# Patient Record
Sex: Male | Born: 1955 | Race: White | Hispanic: No | Marital: Married
Health system: Midwestern US, Academic
[De-identification: ages and names within clinical notes are randomized; demographics above are authoritative.]

---

## 2017-10-20 IMAGING — CR PELVIS
6 series · 6 of 6 positions shown · non-contrast
Comparison: none

[l-spine ap]
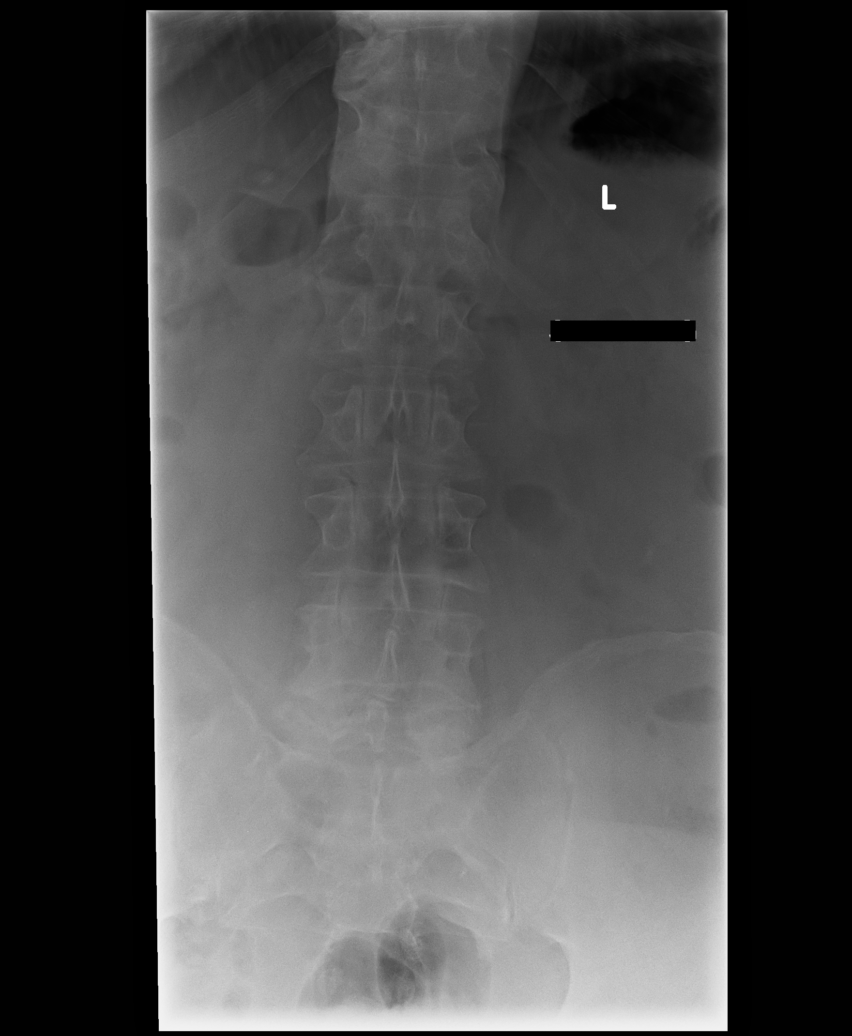

[l-spine obl (1 of 2)]
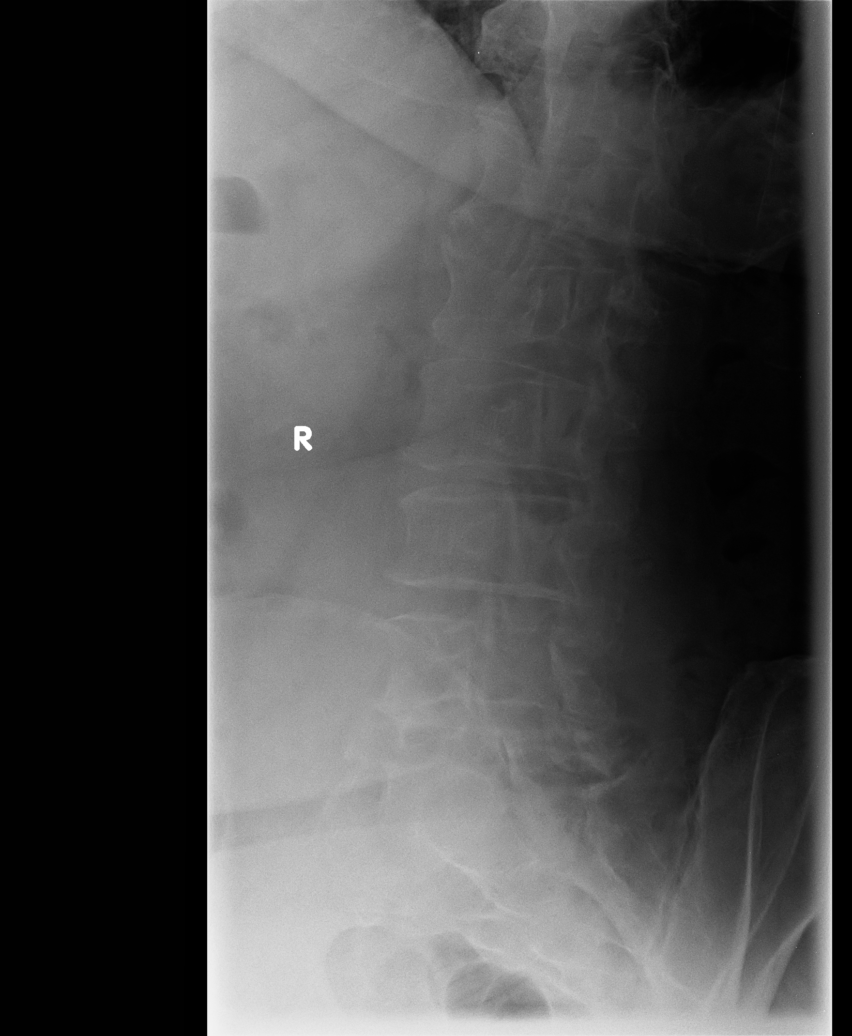

[l-spine obl (2 of 2)]
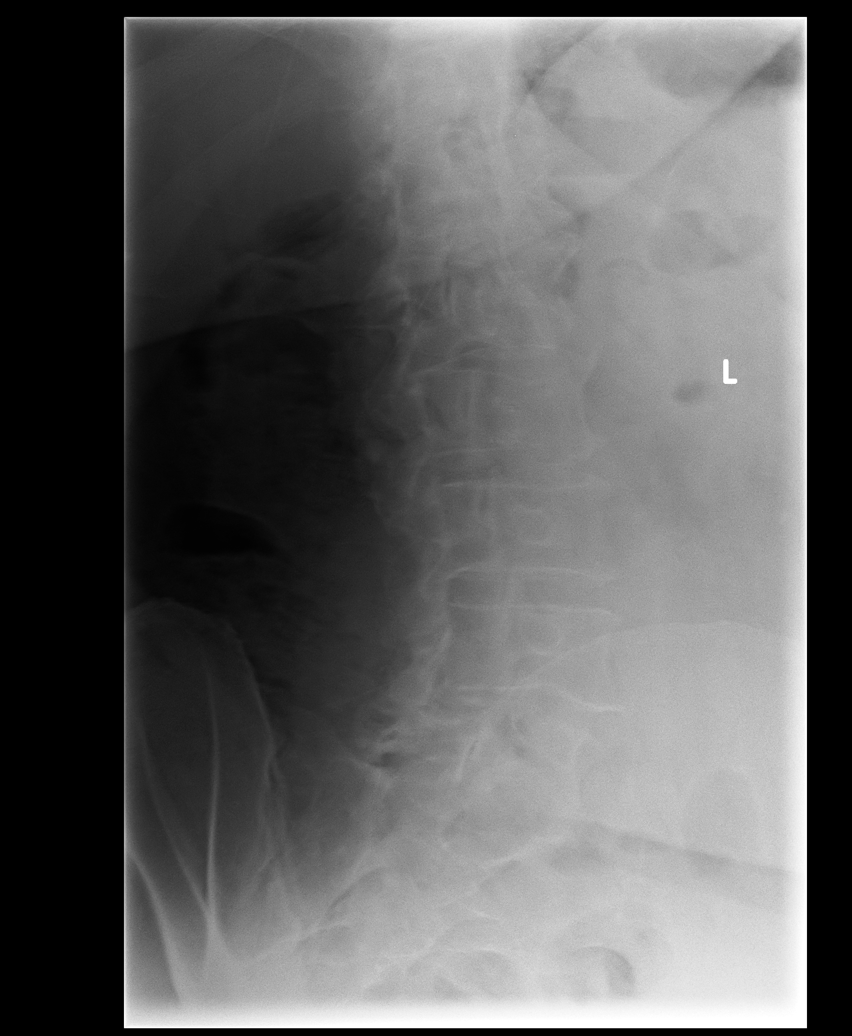

[l-spine lat]
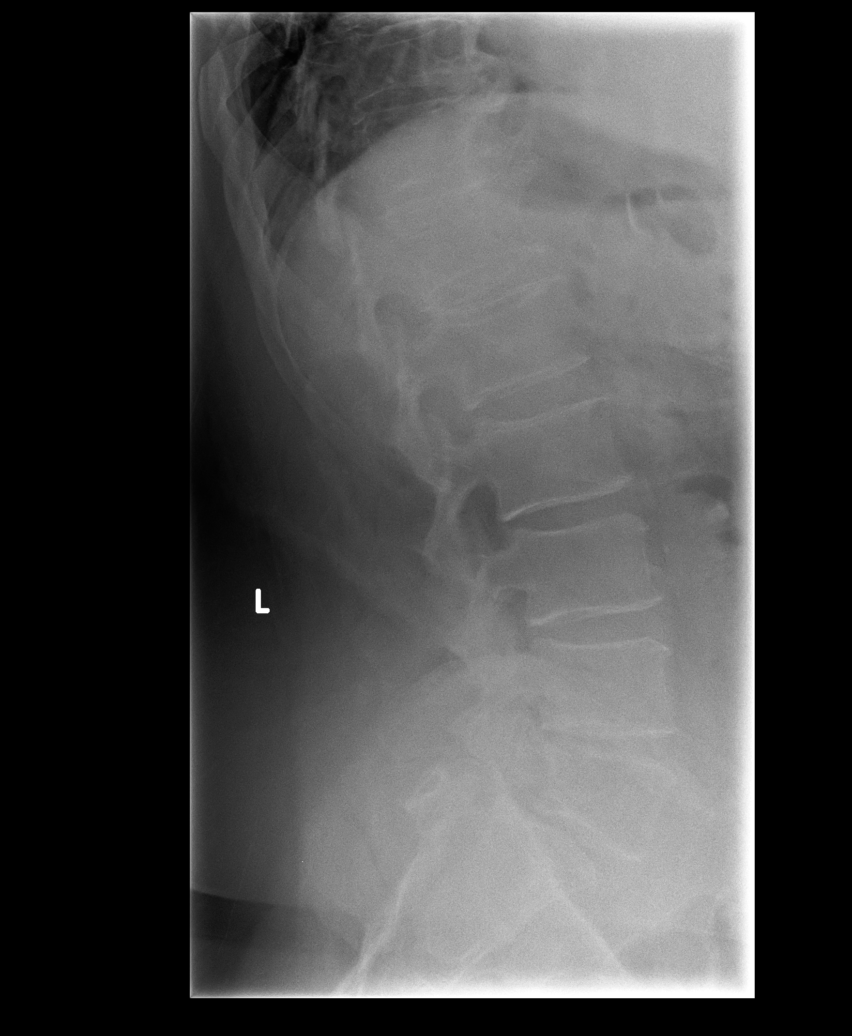

[l-spine lat flex]
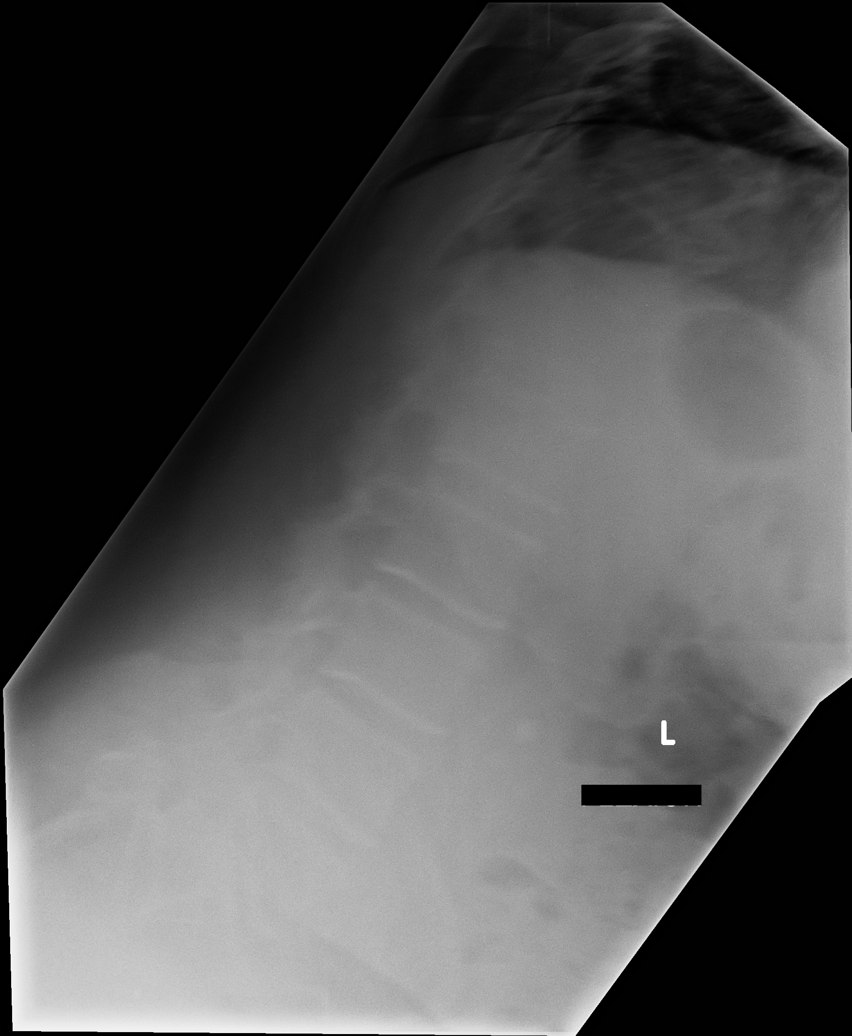

[l-spine lat ext]
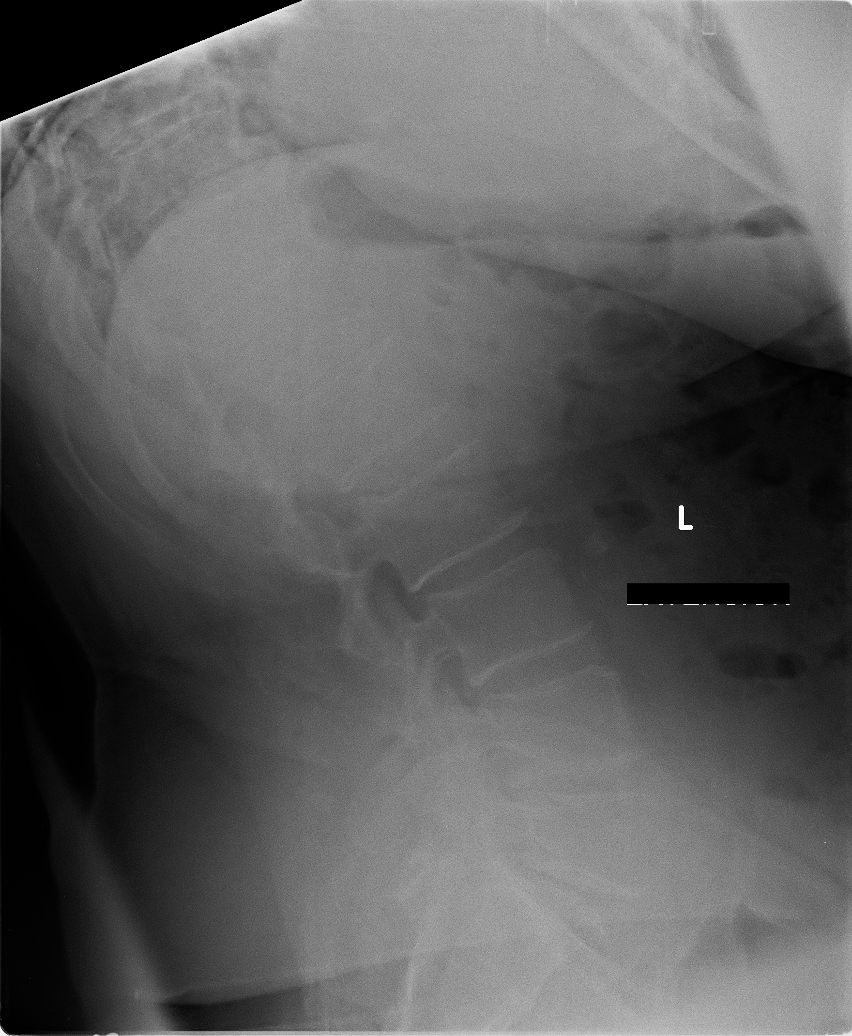

[6 of 6 positions shown; findings below may reference images not displayed]

EXAM

Complete lumbar spine with bending

INDICATION

SPONDYLTHOSIS - PAIN
PT C/O LOW BACK PAIN. TJ/CK

TECHNIQUE

AP, lateral, and oblique views were obtained

COMPARISONS

None available.

FINDINGS

The lumbar vertebral body heights are grossly maintained with mild to moderate multilevel
degenerative changes. There is moderate facet arthrosis. There is mild anterolisthesis of L5 on S1.
There is no significant worsening with flexion or extension. There is moderate stool.

IMPRESSION

1. Moderate degenerative changes without acute osseous abnormality.

## 2021-04-09 ENCOUNTER — Encounter: Admit: 2021-04-09 | Discharge: 2021-04-09 | Payer: BC Managed Care – PPO

## 2021-04-09 ENCOUNTER — Ambulatory Visit: Admit: 2021-04-09 | Discharge: 2021-04-09 | Payer: BC Managed Care – PPO

## 2021-04-09 DIAGNOSIS — R079 Chest pain, unspecified: Secondary | ICD-10-CM

## 2021-04-11 ENCOUNTER — Encounter: Admit: 2021-04-11 | Discharge: 2021-04-11 | Payer: BC Managed Care – PPO

## 2021-04-11 NOTE — Telephone Encounter
04/11/21 - Records requested per [facility] note below lkg    --Burnadette Pop, MD [phone]--

## 2021-04-15 ENCOUNTER — Encounter: Admit: 2021-04-15 | Discharge: 2021-04-15 | Payer: BC Managed Care – PPO

## 2021-04-15 DIAGNOSIS — E785 Hyperlipidemia, unspecified: Secondary | ICD-10-CM

## 2021-04-15 DIAGNOSIS — T8859XA Other complications of anesthesia, initial encounter: Secondary | ICD-10-CM

## 2021-04-15 DIAGNOSIS — R079 Chest pain, unspecified: Secondary | ICD-10-CM

## 2021-04-15 DIAGNOSIS — T148XXA Other injury of unspecified body region, initial encounter: Secondary | ICD-10-CM

## 2021-04-16 ENCOUNTER — Encounter: Admit: 2021-04-16 | Discharge: 2021-04-16 | Payer: BC Managed Care – PPO

## 2021-04-17 ENCOUNTER — Encounter: Admit: 2021-04-17 | Discharge: 2021-04-17 | Payer: No Typology Code available for payment source

## 2021-04-17 DIAGNOSIS — R9439 Abnormal result of other cardiovascular function study: Secondary | ICD-10-CM

## 2021-04-17 DIAGNOSIS — I429 Cardiomyopathy, unspecified: Secondary | ICD-10-CM

## 2021-04-17 DIAGNOSIS — T8859XA Other complications of anesthesia, initial encounter: Secondary | ICD-10-CM

## 2021-04-17 DIAGNOSIS — R079 Chest pain, unspecified: Secondary | ICD-10-CM

## 2021-04-17 DIAGNOSIS — E669 Obesity, unspecified: Secondary | ICD-10-CM

## 2021-04-17 DIAGNOSIS — E785 Hyperlipidemia, unspecified: Secondary | ICD-10-CM

## 2021-04-17 DIAGNOSIS — R5382 Chronic fatigue, unspecified: Secondary | ICD-10-CM

## 2021-04-17 DIAGNOSIS — T148XXA Other injury of unspecified body region, initial encounter: Secondary | ICD-10-CM

## 2021-04-17 LAB — CBC
HEMATOCRIT: 48
HEMOGLOBIN: 15
MCH: 33 — ABNORMAL HIGH (ref 27.0–31.0)
MCHC: 32
MCV: 103 — ABNORMAL HIGH (ref 80.0–99.0)
PLATELET COUNT: 213
RBC COUNT: 4.7
RDW: 12
WBC COUNT: 8.4

## 2021-04-17 LAB — BASIC METABOLIC PANEL: SODIUM: 139

## 2021-04-17 MED ORDER — ASPIRIN 81 MG PO TBEC
81 mg | ORAL_TABLET | Freq: Every day | ORAL | 1 refills | Status: AC
Start: 2021-04-17 — End: ?

## 2021-04-17 MED ORDER — DIPHENHYDRAMINE HCL 25 MG PO CAP
50 mg | Freq: Once | ORAL | 0 refills
Start: 2021-04-17 — End: ?

## 2021-04-17 MED ORDER — JARDIANCE 10 MG PO TAB
10 mg | ORAL_TABLET | Freq: Every day | ORAL | 3 refills | Status: AC
Start: 2021-04-17 — End: ?

## 2021-04-17 MED ORDER — ASPIRIN 325 MG PO TAB
325 mg | Freq: Once | ORAL | 0 refills
Start: 2021-04-17 — End: ?

## 2021-04-17 MED ORDER — PREDNISONE 20 MG PO TAB
60 mg | ORAL_TABLET | ORAL | 0 refills | Status: AC
Start: 2021-04-17 — End: ?

## 2021-04-17 MED ORDER — ATORVASTATIN 80 MG PO TAB
80 mg | ORAL_TABLET | Freq: Every day | ORAL | 3 refills | Status: AC
Start: 2021-04-17 — End: ?

## 2021-04-17 MED ORDER — ENTRESTO 49-51 MG PO TAB
1 | ORAL_TABLET | Freq: Two times a day (BID) | ORAL | 3 refills | Status: AC
Start: 2021-04-17 — End: ?

## 2021-04-17 MED ORDER — METOPROLOL SUCCINATE 25 MG PO TB24
25 mg | ORAL_TABLET | Freq: Every day | ORAL | 1 refills | 90.00000 days | Status: AC
Start: 2021-04-17 — End: ?

## 2021-04-17 MED ORDER — FAMOTIDINE 20 MG PO TAB
20 mg | Freq: Once | ORAL | 0 refills
Start: 2021-04-17 — End: ?

## 2021-04-17 NOTE — Progress Notes
Date of Service: 04/17/2021    Connor JABLONOWSKI is a 65 y.o. male.       HPI     Patient is a 65 year old Caucasian male past medical history of morbid obesity, chronic fatigue syndrome who was seen by a general physician and was having issues with pain in his upper arms bilaterally and increased pounding in his chest with any activity.  Reports he can only go about 100 yards and he have to stop.  This started back in April.  Notes that he otherwise has symptoms where when he lays back in his recliner and in certain positions he knows, bolds, down the midline of his abdomen.  No change to bowel habits, appetite, or other pains in his abdomen.  Denies any specific orthopnea, PND.  Does Have Chronic Lower Back Issues and at Times Left Get up Early Morning Could Be in a Chair Because of His Back Pain.  In Regards to the Arm Pain As Well As Those Pounding Sensations When He Is Active States That Maybe They Have Actually Decreased Her, Leveled off and He Had 2 Events Last Week.  Typically When He Stops They Go Away Very Quickly and Otherwise Not Use Any Kind of Medication or Intervention to Treat Those.  He Did Have an Echo and a Nuclear Stress Test after Seeing His Primary Physician.  It Revealed Left Ventricular Ejection Fraction of 36% with Moderate Sized, Moderate Severity Defect in the Anterior Wall.  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  No Syncope or near Syncopal Type Episodes.  No Family History of Early Coronary Artery Disease, Does Have a Sister Who Had Stents Placed in Her Heart in Her Late 64s.  He Has Never Been a Smoker, No Diabetes Mellitus, No Hypertension, No Hyperlipidemia.       Vitals:    04/17/21 1032 04/17/21 1043   BP: 136/78 128/74   BP Source: Arm, Left Upper Arm, Right Upper   Pulse: 85    SpO2: 95%    O2 Percent: 95 %    O2 Device: None (Room air)    PainSc: Zero    Weight: (!) 163.7 kg (361 lb)    Height: 180.3 cm (5' 11) Body mass index is 50.35 kg/m?Marland Kitchen     Past Medical History  Patient Active Problem List    Diagnosis Date Noted   ? Chronic fatigue syndrome 04/15/2021   ? Hypothyroidism 04/15/2021   ? Obesity 04/15/2021   ? Chest pain 04/15/2021     04/09/21 Regadenoson MPI:  Abnormal.   Moderate size, severe intensity reversible perfusion defect involving the basal anterior, mid anterior and mid anterolateral segments with extension into the the apical lateral portions of the left ventricle.  There is corresponding hypokinesis  Global LV systolic function is moderately impaired.  LVEF 36%.  LV is mildly dilated at rest without any definite transient ischemic dilatation.  Intermediate risk.     ? Hyperlipemia 04/15/2021   ? 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 Unspecified closed fracture of carpal bone 04/01/2012   ? Carpal tunnel syndrome 04/01/2012         Review of Systems   Constitutional: Positive for malaise/fatigue.   HENT: Negative.    Eyes: Negative.    Cardiovascular: Positive for chest pain and leg swelling.  Respiratory: Positive for shortness of breath.    Endocrine: Negative.    Hematologic/Lymphatic: Negative.    Skin: Negative.    Musculoskeletal: Positive for arthritis, back pain, joint pain, neck pain and stiffness.   Gastrointestinal: Negative.    Genitourinary: Negative.    Neurological: Positive for excessive daytime sleepiness, tremors and weakness.   Psychiatric/Behavioral: Negative.    Allergic/Immunologic: Negative.        Physical Exam  Obese, kind of melancholy gentleman but pleasant.  Pupils equal react without scleral injection  Neck is enlarged but no obvious masses or deformity.  No carotid bruits or jugular venous abnormalities.  No hepatojugular abnormality  Lungs clear to auscultation without focal crackles or wheeze  Heart S1, S2 that are normal.  I do not appreciate murmurs, clicks, or gallops  Abdomen is obese but soft and nontender.  I really do not defect a significant abnormality on examination  Pulses are 2+, regular, and symmetric bilaterally radial, ulnar, dorsalis pedis, and anterior tibial locations.  Has minimal edema and does have a scar at the medial aspect of his right malleolus which is well-healed and no significant deformity  No other skin abnormalities or rashes    Cardiovascular Studies    I reviewed his stress test as well as an ECG    Cardiovascular Health Factors  Vitals BP Readings from Last 3 Encounters:   04/17/21 128/74   11/11/12 102/86   09/30/12 145/86     Wt Readings from Last 3 Encounters:   04/17/21 (!) 163.7 kg (361 lb)   11/11/12 (!) 156.5 kg (345 lb)   09/30/12 (!) 156.5 kg (345 lb)     BMI Readings from Last 3 Encounters:   04/17/21 50.35 kg/m?   11/11/12 48.12 kg/m?   09/30/12 310.61 kg/m?      Smoking Social History     Tobacco Use   Smoking Status Never Smoker   Smokeless Tobacco Never Used      Lipid Profile Cholesterol   Date Value Ref Range Status   03/14/2021 185  Final     HDL   Date Value Ref Range Status   03/14/2021 38 (L) >=40 Final     LDL   Date Value Ref Range Status   03/14/2021 125 (H) <100 Final     Triglycerides   Date Value Ref Range Status   03/14/2021 110  Final      Blood Sugar No results found for: HGBA1C  Glucose   Date Value Ref Range Status   03/14/2021 125 (H) 70 - 105 Final          Problems Addressed Today  Encounter Diagnoses   Name Primary?   ? Cardiomyopathy, unspecified type (HCC) Yes   ? Abnormal nuclear stress test    ? Chronic fatigue syndrome    ? Class 3 severe obesity with body mass index (BMI) of 50.0 to 59.9 in adult, unspecified obesity type, unspecified whether serious comorbidity present (HCC)        Assessment and Plan     Findings for significant ischemia in the anterior wall of his left ventricle.  Does have decreased left ventricular ejection fraction on the nuclear stress test.  I do not detect any findings for significant vascular abnormality or valvular abnormality.  Recent blood work did not show significant anemia, renal insufficiency, or electrolyte imbalance.  Because of his symptoms and the results of his cardiac stress test we will start him on medical therapy.  Specifically we will  start him on aspirin 81 mg p.o. daily, atorvastatin 80 mg daily, Jardiance 10 mg p.o. daily, Toprol-XL 25 mg daily, and Entresto 1 tablet twice daily.  If he is unable to afford the [facility] we will move to valsartan.  Have asked him to stop his Mobic at present.  Would prefer Tylenol for his pain control medicine and only use Aleve if really needed.  I am going to arrange for him to have coronary angiograms, left heart catheterization, and possible percutaneous core intervention.  Did review some basic information regarding this test with the patient here.  He is agreeable.  He should contact our office with any question concerns regarding his symptoms or the medications.  We will plan for follow-up within the cardiology clinic within 1 month.         Current Medications (including today's revisions)  ? aspirin EC (ASPIR-LOW) 81 mg tablet Take one tablet by mouth daily. Take with food.   ? atorvastatin (LIPITOR) 80 mg tablet Take one tablet by mouth daily.   ? empagliflozin (JARDIANCE) 10 mg tablet Take one tablet by mouth daily.   ? levothyroxine (SYNTHROID) 175 mcg tablet Take 175 mcg by mouth daily.   ? meloxicam (MOBIC) 15 mg tablet Take 15 mg by mouth daily.   ? metoprolol succinate XL (TOPROL XL) 25 mg extended release tablet Take one tablet by mouth daily.   ? multivit-min/folic/vit K/lycop (ONE-A-DAY MEN'S MULTIVITAMIN PO) Take 1 tablet by mouth daily.   ? naproxen (NAPROSYN) 500 mg tablet Take 500 mg by mouth as Needed.   ? potassium gluconate 600 mg (99 mg) tab Take 650 mg by mouth daily.   ? primidone (MYSOLINE) 50 mg tablet Take 150 mg by mouth daily.   ? sacubitriL-valsartan (ENTRESTO) 49-51 mg tablet Take one tablet by mouth twice daily.

## 2021-04-17 NOTE — Patient Instructions
CARDIAC CATHETERIZATION   PRE-ADMISSION INSTRUCTIONS    Patient Name: Connor Wells  MRN#: [id number]  Date of Birth: 1955-08-29 (65 y.o.)  Today's Date: 04/17/2021    PROCEDURE:  You are scheduled for a Coronary Angiogram with possible Angioplasty/Stenting with Dr. Greig Castilla.    PROCEDURE DATE AND ARRIVAL TIME:  Your procedure date is 04/21/21.  You will receive a call from the Cath lab staff between 8:00 a.m. and noon on the business day prior to your procedure to let you know at what time to arrive on the day of your procedure.    Please check in at the Admitting Desk in the [facility] for your procedure. [facility] Entrance and and take a right. Continue down the hallway past the Cardiovascular Medicine office. That hall will take you into the [facility]. Check in at the desk on the left side.)     (If you have further questions regarding your arrival time for the CV lab, please call [phone] by 3:00pm the day before your procedure. Please leave a message with your name and number, your call will be returned in a timely manner.)    PRE-PROCEDURE APPOINTMENTS:             Pre-Admission lab work required within 14 days of procedure: BMP and CBC at the lab of your choice.                 FOOD AND DRINK INSTRUCTIONS  Nothing to eat after midnight before your procedure. No caffeine for 24 hours prior to your procedure. You will be under moderate sedation for your procedure.  You may drink clear liquids up to an hour before hospital arrival. This will be confirmed by the Cath lab staff the day before your procedure.     SPECIAL MEDICATION INSTRUCTIONS  Any new prescriptions will be sent to your pharmacy listed on file with [place].     Take Prednisone 60mg  the night before AND the morning of the procedure.   Please either take 4 baby aspirins (4 times 81mg ) or one full strength NON-COATED 325mg  aspirin.  Take aspirin the day before and the morning of the procedure        TAKE AM OF PROCEDURE:  HOLD ALL erectile dysfunction medications for 3 days, unless prescribed for pulmonary hypertension.  HOLD ALL over the counter vitamins or supplements on the morning of your procedure.      Additional Instructions  If you wear CPAP, please bring your mask and machine with you to the hospital.    Take a bath or shower with anti-bacterial soap the evening before, or the morning of the procedure.     Bring photo ID and your health insurance card(s).    Arrange for a driver to take you home from the hospital. Please arrange for a friend or family member to take you home from this test. You cannot take a Taxi, [employer] Goad, or public transportation as there has to be a responsible person to help care for you after sedation    Bring an accurate list of your current medications with you to the hospital (all medications and supplements taken daily).  Please use the medication list below and write in the date and time when you took your last dose before your procedure. Update this list of medications as needed.      Wear comfortable clothes and don't bring valuables, other than photo identification card, with you to the hospital.  Please pack a bag for an overnight stay.     Please review your pre-procedure instructions and bring them with you on the day of your procedure.  Call the office at [phone] with any questions. You may ask to speak with Dr. Reola Mosher nurse.        ALLERGIES  Allergies   Allergen Reactions    Contrast Dye Iv, Iodine Containing [Iodinated Contrast Media] SHORTNESS OF BREATH    Codeine UNKNOWN       CURRENT MEDICATIONS  Outpatient Encounter Medications as of 04/17/2021   Medication Sig Dispense Refill    aspirin EC (ASPIR-LOW) 81 mg tablet Take one tablet by mouth daily. Take with food. 90 tablet 1    atorvastatin (LIPITOR) 80 mg tablet Take one tablet by mouth daily. 90 tablet 3    empagliflozin (JARDIANCE) 10 mg tablet Take one tablet by mouth daily. 90 tablet 3    levothyroxine (SYNTHROID) 175 mcg tablet Take 175 mcg by mouth daily.      meloxicam (MOBIC) 15 mg tablet Take 15 mg by mouth daily.      metoprolol succinate XL (TOPROL XL) 25 mg extended release tablet Take one tablet by mouth daily. 90 tablet 1    multivit-min/folic/vit K/lycop (ONE-A-DAY MEN'S MULTIVITAMIN PO) Take 1 tablet by mouth daily.      naproxen (NAPROSYN) 500 mg tablet Take 500 mg by mouth as Needed.      potassium gluconate 600 mg (99 mg) tab Take 650 mg by mouth daily.      primidone (MYSOLINE) 50 mg tablet Take 150 mg by mouth daily.      sacubitriL-valsartan (ENTRESTO) 49-51 mg tablet Take one tablet by mouth twice daily. 60 tablet 3     No facility-administered encounter medications on file as of 04/17/2021.       _________________________________________  Form completed by: Weston Brass  Date completed: 04/17/21  Method: In person and given to the patient.

## 2021-04-17 NOTE — Progress Notes
Website with [employer], confirmed benefits and eligibility:  Current and active since 08/10/20, $1450 deductible with required co-insurance of 20% to max OOP $6300, then plan will pay 100% of allowable charges.      Per [employer] website Prior auth not required for LV Cors [id number], or poss PCI [phone].

## 2021-04-21 ENCOUNTER — Encounter: Admit: 2021-04-21 | Discharge: 2021-04-21 | Payer: No Typology Code available for payment source

## 2021-04-21 DIAGNOSIS — E785 Hyperlipidemia, unspecified: Secondary | ICD-10-CM

## 2021-04-21 DIAGNOSIS — T148XXA Other injury of unspecified body region, initial encounter: Secondary | ICD-10-CM

## 2021-04-21 DIAGNOSIS — T8859XA Other complications of anesthesia, initial encounter: Secondary | ICD-10-CM

## 2021-04-21 DIAGNOSIS — R079 Chest pain, unspecified: Secondary | ICD-10-CM

## 2021-04-21 MED ADMIN — DIPHENHYDRAMINE HCL 50 MG PO CAP [2510]: 50 mg | ORAL | @ 16:00:00 | Stop: 2021-04-21 | NDC 00904205661

## 2021-04-21 MED ADMIN — ATORVASTATIN 40 MG PO TAB [77113]: 80 mg | ORAL | @ 22:00:00 | NDC 00904629261

## 2021-04-21 MED ADMIN — FAMOTIDINE 20 MG PO TAB [10011]: 20 mg | ORAL | @ 15:00:00 | Stop: 2021-04-21 | NDC 63739064510

## 2021-04-21 MED ADMIN — ASPIRIN 81 MG PO CHEW [680]: 243 mg | ORAL | @ 15:00:00 | Stop: 2021-04-21 | NDC 66553000201

## 2021-04-21 MED ADMIN — LEVOTHYROXINE 175 MCG PO TAB [10406]: 175 ug | ORAL | @ 22:00:00 | NDC 00904695761

## 2021-04-21 MED ADMIN — PRIMIDONE 50 MG PO TAB [11129]: 150 mg | ORAL | @ 22:00:00 | NDC 68084020211

## 2021-04-21 MED ADMIN — SODIUM CHLORIDE 0.9 % IV SOLP [27838]: 1000.000 mL | INTRAVENOUS | @ 16:00:00 | Stop: 2021-04-22 | NDC 00338004904

## 2021-04-21 MED ADMIN — PRASUGREL 10 MG PO TAB [173868]: 60 mg | ORAL | @ 19:00:00 | Stop: 2021-04-21 | NDC 00002512301

## 2021-04-21 MED ADMIN — METOPROLOL SUCCINATE 25 MG PO TB24 [81866]: 25 mg | ORAL | @ 22:00:00 | NDC 00904632261

## 2021-04-22 ENCOUNTER — Encounter: Admit: 2021-04-22 | Discharge: 2021-04-22 | Payer: No Typology Code available for payment source

## 2021-04-22 MED ADMIN — SACUBITRIL-VALSARTAN 49-51 MG PO TAB [325931]: 1 | ORAL | @ 03:00:00 | NDC 00078077720

## 2021-04-22 MED ADMIN — LEVOTHYROXINE 175 MCG PO TAB [10406]: 175 ug | ORAL | @ 13:00:00 | Stop: 2021-04-22 | NDC 42292004001

## 2021-05-05 ENCOUNTER — Encounter: Admit: 2021-05-05 | Discharge: 2021-05-05 | Payer: No Typology Code available for payment source

## 2021-05-05 NOTE — Telephone Encounter
Patient had a cath on 04/21/21. The right arm was accessed for procedure. Patient states his right arm is worsening. He complains of "buzzin, coldness, pain in hand, wrist up to shoulder and painful to touch". Patient directed to go to the emergency room for evaluation. Patient verbalizes understanding and agrees with plan.

## 2021-05-06 ENCOUNTER — Encounter: Admit: 2021-05-06 | Discharge: 2021-05-06 | Payer: No Typology Code available for payment source

## 2021-05-13 ENCOUNTER — Encounter: Admit: 2021-05-13 | Discharge: 2021-05-13 | Payer: No Typology Code available for payment source

## 2021-05-13 DIAGNOSIS — G629 Polyneuropathy, unspecified: Secondary | ICD-10-CM

## 2021-05-13 DIAGNOSIS — G9332 Chronic fatigue syndrome: Secondary | ICD-10-CM

## 2021-05-13 DIAGNOSIS — E785 Hyperlipidemia, unspecified: Secondary | ICD-10-CM

## 2021-05-13 DIAGNOSIS — I255 Ischemic cardiomyopathy: Secondary | ICD-10-CM

## 2021-05-13 DIAGNOSIS — T8859XA Other complications of anesthesia, initial encounter: Secondary | ICD-10-CM

## 2021-05-13 DIAGNOSIS — T148XXA Other injury of unspecified body region, initial encounter: Secondary | ICD-10-CM

## 2021-05-13 DIAGNOSIS — R252 Cramp and spasm: Secondary | ICD-10-CM

## 2021-05-13 DIAGNOSIS — R079 Chest pain, unspecified: Secondary | ICD-10-CM

## 2021-05-13 DIAGNOSIS — E8881 Metabolic syndrome: Secondary | ICD-10-CM

## 2021-05-13 MED ORDER — SPIRONOLACTONE 25 MG PO TAB
25 mg | ORAL_TABLET | Freq: Every day | ORAL | 1 refills | 90.00000 days | Status: AC
Start: 2021-05-13 — End: ?

## 2021-05-13 NOTE — Progress Notes
Date of Service: 05/13/2021    Connor Wells is a 65 y.o. male.       HPI     Patient is a 65 year old Caucasian male past medical history of metabolic syndrome manifest by obesity, likely untreated and undiagnosed obstructive sleep apnea, hypertension, hyperlipidemia with chronic fatigue syndrome who was evaluated for exertional shortness of breath and heaviness in his chest and found to have a decreased left ventricular ejection fraction with significant defect suggestive of ischemia in the anterior wall.  Patient underwent coronary angiograms on 12 September.  Was found to have a significant stenosis in the mid LAD.  Stent was placed without complications.  Patient had approached from the right radial artery.  Was noted to have significant ectopy primarily ventricular in the recovery window.  This was asymptomatic.  Did have an increase from the Toprol-XL before discharge from his procedure and intervention.  Had a Zio patch placed for 14 days to further evaluate.  He had been started on medical therapy including statin, Entresto, Toprol-XL, and Jardiance before having his procedure.  Did present to the emergency room later in the month because of numbness and discomfort especially in that right hand and arm.  Evaluation did not show significant abnormality from a vascular or neurological standpoint.  They did do a arterial ultrasound that showed patency of both the radial and ulnar vessels.  Patient has a history of carpal tunnel and fracture involving that right wrist and hand.  Previous intervention was performed approximately 19 years ago.  Reports since being in the emergency room is still having some symptoms maybe not as bad.  Did initiate cardiac rehab yesterday.  Has still struggled with severe fatigue and struggling with walking.  Is not having palpitations.  The CO monitor did show frequent PVCs greater than 6% with occasional PACs.  Had short burst of grouped PVCs with heart rate typically around 160 bpm.  Is not having orthopnea or PND.  No bleeding or bruising concerns and otherwise is tolerating medications.  Does have concerns because he has profound cramps especially in his hips and lower legs if he does not take potassium.         Vitals:    05/13/21 0833   BP: 122/82   BP Source: Arm, Left Upper   Pulse: 72   SpO2: 96%   O2 Percent: 96 %   O2 Device: None (Room air)   PainSc: Two   Weight: (!) 161.1 kg (355 lb 3.2 oz)   Height: 180.3 cm (5' 11)     Body mass index is 49.54 kg/m?Marland Kitchen     Past Medical History  Patient Active Problem List    Diagnosis Date Noted   ? Abnormal stress test 04/21/2021   ? Cardiomyopathy, unspecified (HCC) 04/21/2021   ? Coronary artery disease due to lipid rich plaque 04/21/2021     LHC 04/21/21: PCI LAD      ? Chronic fatigue syndrome 04/15/2021   ? Hypothyroidism 04/15/2021   ? Obesity 04/15/2021   ? Chest pain 04/15/2021     04/09/21 Regadenoson MPI:  Abnormal.   Moderate size, severe intensity reversible perfusion defect involving the basal anterior, mid anterior and mid anterolateral segments with extension into the the apical lateral portions of the left ventricle.  There is corresponding hypokinesis  Global LV systolic function is moderately impaired.  LVEF 36%.  LV is mildly dilated at rest without any definite transient ischemic dilatation.  Intermediate risk.     ?  Hyperlipemia 04/15/2021   ? 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 Unspecified closed fracture of carpal bone 04/01/2012   ? Carpal tunnel syndrome 04/01/2012         Review of Systems   Constitutional: Positive for malaise/fatigue.   HENT: Negative.    Eyes: Negative.    Cardiovascular: Positive for chest pain, dyspnea on exertion, irregular heartbeat and leg swelling.        Chest heaviness   Respiratory: Negative.    Endocrine: Positive for polyuria. Hematologic/Lymphatic: Negative.    Skin: Negative.    Musculoskeletal: Positive for myalgias.        Right arm pain   Gastrointestinal: Negative.    Genitourinary: Positive for frequency and nocturia.   Neurological: Positive for headaches.   Psychiatric/Behavioral: Negative.    Allergic/Immunologic: Negative.        Physical Exam  Obese.  No acute distress but some of the somber and down turned mood  Pupils equal reactive without scleral injection, he wears glasses  Neck is supple but large.  Symmetric with no obvious abnormalities.  Difficult to determine landmarks.  I do not appreciate any bruits in did not detect any abnormalities in the jugular venous patterns  Lungs are clear to auscultation with good effort  Heart S1, S2 that are normal.  No murmurs, clicks, or gallops  Pulses 2+, regular with intermittent irregular changes.  These are relatively few.  They are symmetric bilaterally radial, ulnar, and pedal locations.  Minimal edema but no skin abnormalities are noted    Cardiovascular Studies      Cardiovascular Health Factors  Vitals BP Readings from Last 3 Encounters:   05/13/21 122/82   04/22/21 133/80   04/17/21 128/74     Wt Readings from Last 3 Encounters:   05/13/21 (!) 161.1 kg (355 lb 3.2 oz)   04/21/21 (!) 161.5 kg (356 lb 0.7 oz)   04/17/21 (!) 163.7 kg (361 lb)     BMI Readings from Last 3 Encounters:   05/13/21 49.54 kg/m?   04/21/21 49.68 kg/m?   04/17/21 50.35 kg/m?      Smoking Social History     Tobacco Use   Smoking Status Never Smoker   Smokeless Tobacco Never Used      Lipid Profile Cholesterol   Date Value Ref Range Status   03/14/2021 185  Final     HDL   Date Value Ref Range Status   03/14/2021 38 (L) >=40 Final     LDL   Date Value Ref Range Status   03/14/2021 125 (H) <100 Final     Triglycerides   Date Value Ref Range Status   03/14/2021 110  Final      Blood Sugar No results found for: HGBA1C  Glucose   Date Value Ref Range Status   05/05/2021 108 (H) 70 - 105 Final   04/22/2021 91 70 - 100 MG/DL Final   45/40/9811 94  Final          Problems Addressed Today  Encounter Diagnoses   Name Primary?   ? Metabolic syndrome Yes   ? Ischemic cardiomyopathy    ? Neuropathy    ? Chronic fatigue syndrome    ? Class 3 severe obesity with body mass index (BMI) of 50.0 to 59.9 in adult, unspecified obesity type, unspecified whether serious comorbidity present (HCC)    ?  Cramps of lower extremity    ? Hyperlipidemia, unspecified hyperlipidemia type        Assessment and Plan   Hemodynamics are stable.  Patient continues to struggle with symptoms that are somewhat vague post coronary intervention.  He is tolerating medications and has initiated cardiac rehab.  At this time really reinforced importance of cardiac rehab.  We will see if I can optimize his medication by bring him off the potassium or limiting his use is much as possible starting spironolactone at 25 mg daily.  I have advised him to use tonic water containing small doses of quinine to help with the leg cramps initially by 4 6 ounces at night and may use again in the day if needed.  We will schedule him for an echocardiogram 40 days post intervention to reassess his LV ejection fraction at that time would also like to do some blood work to assess his renal function electrolytes and lipid profile.  We will contact him about the results and plan for intervention or other therapies based upon those findings.         Current Medications (including today's revisions)  ? aspirin EC (ASPIR-LOW) 81 mg tablet Take one tablet by mouth daily. Take with food.   ? atorvastatin (LIPITOR) 80 mg tablet Take one tablet by mouth daily.   ? empagliflozin (JARDIANCE) 10 mg tablet Take one tablet by mouth daily.   ? levothyroxine (SYNTHROID) 175 mcg tablet Take 175 mcg by mouth daily.   ? meloxicam (MOBIC) 15 mg tablet Take 15 mg by mouth daily.   ? metoprolol succinate XL (TOPROL XL) 25 mg extended release tablet Take 1.5 tablets by mouth daily.   ? multivit-min/folic/vit K/lycop (ONE-A-DAY MEN'S MULTIVITAMIN PO) Take 1 tablet by mouth daily.   ? naproxen (NAPROSYN) 500 mg tablet Take 500 mg by mouth as Needed.   ? potassium gluconate 600 mg (99 mg) tab Take 650 mg by mouth daily.   ? prasugreL (EFFIENT) 10 mg tablet Take one tablet by mouth daily.   ? predniSONE (DELTASONE) 20 mg tablet Take three tablets by mouth as directed. Take 60mg  the day before and the morning of the procedure   ? primidone (MYSOLINE) 50 mg tablet Take 150 mg by mouth daily.   ? sacubitriL-valsartan (ENTRESTO) 49-51 mg tablet Take one tablet by mouth twice daily.   ? spironolactone (ALDACTONE) 25 mg tablet Take one tablet by mouth daily. Take with food.

## 2021-05-16 ENCOUNTER — Encounter: Admit: 2021-05-16 | Discharge: 2021-05-16 | Payer: No Typology Code available for payment source

## 2021-05-16 DIAGNOSIS — E782 Mixed hyperlipidemia: Secondary | ICD-10-CM

## 2021-05-16 DIAGNOSIS — R9439 Abnormal result of other cardiovascular function study: Secondary | ICD-10-CM

## 2021-05-16 DIAGNOSIS — R079 Chest pain, unspecified: Secondary | ICD-10-CM

## 2021-05-16 DIAGNOSIS — I2510 Atherosclerotic heart disease of native coronary artery without angina pectoris: Secondary | ICD-10-CM

## 2021-05-16 DIAGNOSIS — I429 Cardiomyopathy, unspecified: Secondary | ICD-10-CM

## 2021-05-16 NOTE — Telephone Encounter
Chest Pain    Current or past: this morning at cardiac rehab   Length of episode: 10-65min, but all episodes vary  Pain Description: intermittent, sharp, dull, aching and pressure  Radiating?: some episodes cause arm aching  Pain change with movement?: no  Associated Symptoms: headache, some shortness of breath  Precipitating factors: most episodes occur during activity  Take anything for pain?: no  Additional relevant information: Received call from [facility] nurse stating that patient was there this morning exercising and developed from chest pain that he rated at a 5-6/10. The pain did not radiate anywhere. There was an associated headache, but no other symptoms. Patient did complain of chronic fatigue. Vital signs were stable: BP112/60, HR 72, O2 96%. RN stated that patient did seem to have more PVCs, some in pairs, on his EKG than previously, but she states that she pulled an older EKG and which also showed some paired PVCs. Patient was advised to go to the ER to be examined, but he refused since his chest pain resolved after about 10-15 minutes. Patient was evaluated by the Director of their Cardiac Rehab who is a physician and cleared patient to go home.     Patient recently had a heart cath 04/21/21 with a stent to his LAD.     Called and spoke to patient. Patient states that he has always had fatigue. He states that he feels about the same as he did before his stent placement on 04/21/21. Patient states that most of the time his chest pain occurs while working or being physically active. He states the episodes don't last long. Usually resting resolves his symptoms. He does state that he develops headaches and some shortness of breath with these episodes. Patient states that these episodes have been ongoing since heart cath/stent placement. Confirmed that patient is taking all of his medications as prescribed including his Effient.     Care Advice: Informed patient that he should go to [facility] to be evaluated, he refused to at this time. He states that he is back to normal and would really like to avoid the hospital if possible. Informed patient to keep his phone close. I would reach out to him today with Dr. Tonna Corner recommendations.  Patient verbalized understanding.    Disposition: RN to Call Patient Back with Provider Recommendations

## 2021-06-11 ENCOUNTER — Encounter: Admit: 2021-06-11 | Discharge: 2021-06-11 | Payer: No Typology Code available for payment source

## 2021-06-11 ENCOUNTER — Ambulatory Visit: Admit: 2021-06-11 | Discharge: 2021-06-11 | Payer: No Typology Code available for payment source

## 2021-06-11 DIAGNOSIS — I25119 Atherosclerotic heart disease of native coronary artery with unspecified angina pectoris: Secondary | ICD-10-CM

## 2021-06-17 ENCOUNTER — Encounter: Admit: 2021-06-17 | Discharge: 2021-06-17 | Payer: No Typology Code available for payment source

## 2021-06-17 DIAGNOSIS — I25119 Atherosclerotic heart disease of native coronary artery with unspecified angina pectoris: Secondary | ICD-10-CM

## 2021-06-17 DIAGNOSIS — R079 Chest pain, unspecified: Secondary | ICD-10-CM

## 2021-06-17 DIAGNOSIS — R9439 Abnormal result of other cardiovascular function study: Secondary | ICD-10-CM

## 2021-06-17 DIAGNOSIS — E785 Hyperlipidemia, unspecified: Secondary | ICD-10-CM

## 2021-06-18 ENCOUNTER — Encounter: Admit: 2021-06-18 | Discharge: 2021-06-18 | Payer: No Typology Code available for payment source

## 2021-06-18 NOTE — Telephone Encounter
-----   Message from Agapito Games, RN sent at 06/18/2021  9:17 AM CST -----    ----- Message -----  From: Levora Angel, MD  Sent: 06/18/2021   9:03 AM CST  To: [facility] Nurse [facility]    Let him know stress test did show clear abnormality, quality of the test limits accuracy  ----- Message -----  From: Laurence Aly, MD  Sent: 06/14/2021   7:18 AM CST  To: Levora Angel, MD

## 2021-06-18 NOTE — Telephone Encounter
From: Levora Angel, MD   Sent: 06/18/2021  8:56 AM CST   To: [facility] Nurse [facility] Team [facility]     Let him know the echo shows his left heart function is good and valve findings are normal.

## 2021-06-18 NOTE — Telephone Encounter
Called and discussed results with patient.  No questions at this time.  Pt will callback with any questions, concerns or problems.

## 2021-06-27 ENCOUNTER — Encounter

## 2021-06-27 DIAGNOSIS — E8881 Metabolic syndrome: Secondary | ICD-10-CM

## 2021-06-27 DIAGNOSIS — I2510 Atherosclerotic heart disease of native coronary artery without angina pectoris: Secondary | ICD-10-CM

## 2021-06-27 DIAGNOSIS — G629 Polyneuropathy, unspecified: Secondary | ICD-10-CM

## 2021-06-27 DIAGNOSIS — R9439 Abnormal result of other cardiovascular function study: Secondary | ICD-10-CM

## 2021-06-27 DIAGNOSIS — I255 Ischemic cardiomyopathy: Secondary | ICD-10-CM

## 2021-06-27 DIAGNOSIS — G9332 Chronic fatigue syndrome: Secondary | ICD-10-CM

## 2021-06-27 DIAGNOSIS — R252 Cramp and spasm: Secondary | ICD-10-CM

## 2021-06-27 DIAGNOSIS — E785 Hyperlipidemia, unspecified: Secondary | ICD-10-CM

## 2021-06-27 DIAGNOSIS — R079 Chest pain, unspecified: Secondary | ICD-10-CM

## 2021-06-27 DIAGNOSIS — E782 Mixed hyperlipidemia: Secondary | ICD-10-CM

## 2021-06-27 DIAGNOSIS — I429 Cardiomyopathy, unspecified: Secondary | ICD-10-CM

## 2021-06-27 LAB — COMPREHENSIVE METABOLIC PANEL
ALBUMIN: 3.6
ALK PHOSPHATASE: 99
ALT: 38
AST: 20
POTASSIUM: 4.4
SODIUM: 140
TOTAL BILIRUBIN: 0.6

## 2021-06-27 LAB — LIPID PROFILE
CHOLESTEROL: 114 — ABNORMAL HIGH (ref 98–107)
HDL: 40
LDL: 61
TRIGLYCERIDES: 69
VLDL: 14 — ABNORMAL HIGH (ref 70–105)

## 2021-06-27 LAB — C REACTIVE PROT-HI SENSITIVITY: C-REACTIVE PROTEIN: 0.3 — ABNORMAL HIGH (ref 0.1–0.3)

## 2021-08-08 ENCOUNTER — Encounter: Admit: 2021-08-08 | Discharge: 2021-08-08 | Payer: No Typology Code available for payment source

## 2021-08-08 MED ORDER — ENTRESTO 49-51 MG PO TAB
ORAL_TABLET | Freq: Two times a day (BID) | 3 refills | Status: AC
Start: 2021-08-08 — End: ?

## 2021-08-08 NOTE — Telephone Encounter
Pt called and states that he went to cardiac rehab this morning, but was told he needed to go to the ED.  According to the patient, his BP was 200/90s, he was feeling lightheaded, fatigued and dizzy and has had intermittent chest pain for the past few days.  When I called pt back to discuss, he was being checked in to the ED at [facility] in [place].  Pt will call us with an update and if he needs an appointment sooner than scheduled.

## 2021-08-12 ENCOUNTER — Encounter: Admit: 2021-08-12 | Discharge: 2021-08-12 | Payer: No Typology Code available for payment source

## 2021-08-12 ENCOUNTER — Ambulatory Visit: Admit: 2021-08-12 | Discharge: 2021-08-13 | Payer: No Typology Code available for payment source

## 2021-08-12 DIAGNOSIS — I25119 Atherosclerotic heart disease of native coronary artery with unspecified angina pectoris: Secondary | ICD-10-CM

## 2021-08-12 DIAGNOSIS — I428 Other cardiomyopathies: Secondary | ICD-10-CM

## 2021-08-12 DIAGNOSIS — I255 Ischemic cardiomyopathy: Secondary | ICD-10-CM

## 2021-08-12 DIAGNOSIS — G629 Polyneuropathy, unspecified: Secondary | ICD-10-CM

## 2021-08-12 DIAGNOSIS — E785 Hyperlipidemia, unspecified: Secondary | ICD-10-CM

## 2021-08-12 DIAGNOSIS — R079 Chest pain, unspecified: Secondary | ICD-10-CM

## 2021-08-12 DIAGNOSIS — G9332 Chronic fatigue syndrome: Secondary | ICD-10-CM

## 2021-08-12 DIAGNOSIS — T148XXA Other injury of unspecified body region, initial encounter: Secondary | ICD-10-CM

## 2021-08-12 DIAGNOSIS — E8881 Metabolic syndrome: Secondary | ICD-10-CM

## 2021-08-12 DIAGNOSIS — R252 Cramp and spasm: Secondary | ICD-10-CM

## 2021-08-12 DIAGNOSIS — T8859XA Other complications of anesthesia, initial encounter: Secondary | ICD-10-CM

## 2021-08-12 MED ORDER — LOSARTAN 50 MG PO TAB
50 mg | ORAL_TABLET | Freq: Every day | ORAL | 1 refills | 90.00000 days | Status: DC
Start: 2021-08-12 — End: 2021-08-12

## 2021-08-12 MED ORDER — ENTRESTO 49-51 MG PO TAB
1 | ORAL_TABLET | Freq: Two times a day (BID) | ORAL | 3 refills | Status: AC
Start: 2021-08-12 — End: ?

## 2021-08-12 NOTE — Patient Instructions
Thank you for visiting our office today.    We would like to make the following medication adjustments:     Stop Entresto    Start Losartan 50mg  daily      Otherwise continue the same medications as you have been doing.          We will be pursuing the following tests after your appointment today:       Orders Placed This Encounter    losartan (COZAAR) 50 mg tablet         We will plan to see you back in 6 months.  Please call us in the meantime with any questions or concerns.        Please allow 5-7 business days for our providers to review your results. All normal results will go to MyChart. If you do not have Mychart, it is strongly recommended to get this so you can easily view all your results. If you do not have mychart, we will attempt to call you once with normal lab and testing results. If we cannot reach you by phone with normal results, we will send you a letter.  If you have not heard the results of your testing after one week please give [place] a call.       Your Cardiovascular Medicine [place] Team Brett Canales, Pilar Jarvis, Shawna Orleans, and [place])  phone number is [phone].

## 2021-08-12 NOTE — Progress Notes
Date of Service: 08/12/2021    Connor Wells is a 66 y.o. male.       HPI   Connor Wells has been followed for heart failure, coronary artery disease and hypercholesterolemia.  In August 2022 he presented with classic angina.  He would have midsternal pressure-like exertional chest discomfort while walking to his shed.  It would last for several minutes and resolve promptly with rest.  On April 21, 2021 he underwent stenting of the left anterior descending coronary artery with excellent results.  He reports no recurrent angina since coronary intervention.  He does have chronic left pectoral discomfort which may last for hours and is not exertional.  This is likely musculoskeletal and the patient also believes this is musculoskeletal.  It is very dissimilar to the exertional midsternal discomfort that he noticed while walking to his shed prior to coronary stenting.  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  He used to take gabapentin with good results but has not taken this medication recently.  He reports no adverse effects to gabapentin.  I get the impression, that he would like to minimize his medications as much as possible. Connor Wells is still participating in cardiac rehab where he exercises for 40 minutes twice a week.  His blood pressure is generally very well controlled.  Evidently on 08/08/2021 his systolic blood pressure was 200 mmHg at cardiac rehab, although it is much better when seen later that morning by primary care.  The blood pressure reported that day by primary care was 130/74 mmHg and on repeat was 112/71 mmHg.  The patient notices infrequent mild palpitations that are not very bothersome to him.  Otherwise, Connor Wells indicates that he has been stable. The patient reports no angina, congestive symptoms, sensation of sustained forceful heart pounding, lightheadedness or syncope.  His exercise tolerance has been stable. The patient reports no myalgias, claudication, bleeding abnormalities, or strokelike symptoms.       Vitals:    08/12/21 1131   BP: 110/70   BP Source: Arm, Left Upper   Pulse: 63   SpO2: 98%   O2 Device: None (Room air)   PainSc: Zero   Weight: (!) 160.1 kg (353 lb)   Height: 182.9 cm (6')     Body mass index is 47.88 kg/m?Marland Kitchen     Past Medical History  Patient Active Problem List    Diagnosis Date Noted   ? Coronary artery disease with angina pectoris, unspecified vessel or lesion type, unspecified whether native or transplanted heart (HCC) 08/12/2021   ? Abnormal stress test 04/21/2021   ? Cardiomyopathy, unspecified (HCC) 04/21/2021   ? Coronary artery disease due to lipid rich plaque 04/21/2021     LHC 04/21/21: PCI LAD      ? Chronic fatigue syndrome 04/15/2021   ? Hypothyroidism 04/15/2021   ? Obesity 04/15/2021   ? Chest pain 04/15/2021     04/09/21 Regadenoson MPI:  Abnormal.   Moderate size, severe intensity reversible perfusion defect involving the basal anterior, mid anterior and mid anterolateral segments with extension into the the apical lateral portions of the left ventricle.  There is corresponding hypokinesis  Global LV systolic function is moderately impaired.  LVEF 36%.  LV is mildly dilated at rest without any definite transient ischemic dilatation.  Intermediate risk.     ? Hyperlipemia 04/15/2021   ? 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 Unspecified closed fracture of carpal bone 04/01/2012   ? Carpal tunnel syndrome 04/01/2012         Review of Systems   Constitutional: Negative.   HENT: Negative.    Eyes: Negative.    Cardiovascular: Negative.    Respiratory: Negative.    Endocrine: Negative.    Hematologic/Lymphatic: Negative.    Skin: Negative.    Musculoskeletal: Negative.    Gastrointestinal: Negative.    Genitourinary: Negative.    Neurological: Negative.    Psychiatric/Behavioral: Negative. Allergic/Immunologic: Negative.        Physical Exam  GENERAL: The patient is well developed, well nourished, resting comfortably and in no distress.   HEENT: No abnormalities of the visible oro-nasopharynx, conjunctiva or sclera are noted.  NECK: There is no jugular venous distension. Carotids are palpable and without bruits. There is no thyroid enlargement.  Chest: Lung fields are clear to auscultation. There are no wheezes or crackles.  CV: There is a regular rhythm. The first and second heart sounds are normal. There are no murmurs, gallops or rubs.  ABD: The abdomen is soft and supple with normal bowel sounds. There is no hepatosplenomegaly, ascites, tenderness, masses or bruits.  Neuro: There are no focal motor defects. Ambulation is normal. Cognitive function appears normal.  Ext: There is no edema or evidence of deep vein thrombosis. Peripheral pulses are satisfactory.    SKIN: There are no rashes and no cellulitis  PSYCH: The patient is calm, rationale and oriented.    Cardiovascular Studies  A twelve-lead ECG obtained on 08/08/2021 revealed normal sinus rhythm with a heart rate of 61 bpm.  Right bundle branch block is noted.  There is a possible inferior infarct, age old or indeterminate.  This ECG diagnosis is not definite since he may have small R waves in lead III.  The inferior leads look very similar to the prior ECG obtained on April 22, 2021.    Echo Doppler June 17, 2021:  Interpretation Summary    ?  ? The left ventricular systolic function is normal. The visually estimated ejection fraction is 60%.  ? Grade I (mild) left ventricular diastolic dysfunction. Normal left atrial pressure.  ? The right ventricle is mildly dilated with preserved systolic function.  ? No significant valvular abnormalities.  ? Pulmonary artery pressure could not be calculated by this study due to incomplete TR signal.       No previous study available for comparison.   ?  Echocardiographic Findings    Left Ventricle The left ventricle is mildly dilated. The left ventricular wall thickness is normal. The left ventricular systolic function is normal. The visually estimated ejection fraction is 60%. Grade I (mild) left ventricular diastolic dysfunction. Normal left atrial pressure.   Right Ventricle Ventricle not well seen. The right ventricle is mildly dilated with preserved systolic function.  The pulmonary artery pressure could not be estimated due to inadequate tricuspid regurgitation signal.   Left Atrium Normal size.   Right Atrium Not well seen.   IVC/SVC Inferior vena cava was not well seen; right atrial pressure could not be estimated.   Mitral Valve Non-specific thickening. No stenosis. No regurgitation.   Tricuspid Valve The tricuspid valve was not well seen. No stenosis. No regurgitation.   Aortic Valve Normal valve structure. The valve has focal thickening. No stenosis. No regurgitation.   Pulmonary The pulmonic valve was not seen well but no Doppler evidence of stenosis.   The pulmonary  artery was not well seen.   Aorta Normal aorta. The aortic root and ascending aorta are normal in size.   Pericardium Pericardial fat pad present. No pericardial effusion.     Coronary angiography/intervention 04/21/2021:  SELECTIVE CORONARY ANGIOGRAPHY:    1. Left main coronary artery:  The left main coronary artery arises normally from the left coronary sinus.  The left main artery bifurcates into left anterior descending artery and left circumflex artery.  The left main coronary artery is free of angiographically significant disease.  2. Left anterior descending artery:  The left anterior descending artery is a large caliber vessel.  The proximal left anterior descending artery immediately proximal to the septal branch has an 80% to 90% stenosis.  The distal LAD has luminal irregularities.  This is a type 3 LAD.  The LAD gives rise to a large caliber diagonal vessel which has a 90% stenosis at the proximal segment.  It then bifurcates into a superior inferior branch.  The superior branch has 70% to 80% stenosis at the ostium, and the inferior branch has a 40% stenosis at the proximal segment.  3. Left circumflex artery:  The left circumflex artery is a medium caliber vessel which gives rise to 2 to a branching medium caliber obtuse marginal branch.  The left circumflex artery as well as its branches are free of angiographically significant disease.  4. Right coronary artery:  The right coronary artery is a large caliber dominant vessel which arises normally from the right coronary sinus.  The right coronary artery has a 20% to 30% stenosis at the mid segment.  It distally bifurcates into right posterior descending artery and right posterolateral branch which are free of angiographically significant disease.  ??  LESION CHARACTERISTICS:    1. Lesion location:  Proximal left anterior descending artery.  2. Severity of stenosis prior to intervention:  90% with TIMI-3 flow distally.  3. Severity of stenosis post intervention:  0% with TIMI-3 flow distally.  4. Lesion type:  ACC/AHA type A.  ?  FINAL IMPRESSION:    1. The proximal left anterior descending artery had a severe 90% stenosis.  Successful percutaneous coronary intervention was performed with 3.0 X 18 mm Onyx Frontier drug-eluting stent postdilated with 3.75 NC.  2. The left ventricular end-diastolic pressure was 16 mmHg.  3. No significant gradient across the aortic valve on pullback.  ?  RECOMMENDATION:    1. Aspirin 81 mg daily for life.  2. Prasugrel 10 mg daily for at least 6 months.  3. Aggressive risk factor modification.  4. Referral for cardiac rehab.  5. Routine post PCI care.    Regadenoson  thallium stress test 06/11/2021:  Scintigraphic (planar/tomographic): Summed Stress Score:  0   , Summed Rest Score:  0. Post stress projection datasets show intense bowel activity.  Tomography in grayscale also shows very profound hotspot artifact in the resting and post stress image datasets near the apex.  There are indeterminant reversible defects probably due to the associated confounding effect of hotspot artifact and liver shine through.  Nevertheless qualitatively reversibility extends throughout the anterolateral and inferolateral walls. ?  Regional Wall Thickening and Motion Post Stress: Global left ventricular function is mildly depressed measuring 45% with mild global hypokinesis possibly more marked at the inferior septal base. ?  Left Ventricular Ejection Fraction (post stress, in the resting state) =? 45 %. Left Ventricular End Diastolic Volume: 161 mL. SUMMARY/OPINION:?? This study is technically problematic and equivocal due to body habitus  and class III obesity.  There is very prominent bowel activity artifact.  Overall the study suggests no statistically significant ischemia according to quantitative polar maps but qualitatively there is extensive and diffuse reversible uptake throughout the left ventricular myocardium except for the septum.  Global left ventricular ejection fraction is mildly depressed with a calculated left ventricular ejection fraction of 45%. Because of the limitations and and the equivocal nature of SPECT imaging in this patient with a BMI of nearly 50, recommend nitrogen 13 ammonia regadenoson stress PET/CT to eliminate confounding attenuation artifacts and to quantitate regional myocardial blood flow under pharmacological stress. This exam is compared to a previous myocardial perfusion study using technetium sestamibi performed on 04/09/2021. ?  The previous study also was performed with regadenoson with right bundle branch block and sinus rhythm and was negative for EKG evidence of ischemia.  The previous study had a left ventricular ejection fraction of 36% with a summed stress score of 12 and a rest score of 0.  The previous study left ventricular end-diastolic volume was 181 mL.  The study was interpreted to show moderate-severe reversible basilar anterior and mid anterolateral ischemia extending to the apex. Qualitatively the studies appear to be similar on the static image datasets but the summed stress score is dramatically less on the current exam.      Cardiovascular Health Factors  Vitals BP Readings from Last 3 Encounters:   08/12/21 110/70   06/17/21 105/55   05/13/21 122/82     Wt Readings from Last 3 Encounters:   08/12/21 (!) 160.1 kg (353 lb)   06/17/21 (!) 158.8 kg (350 lb)   05/13/21 (!) 161.1 kg (355 lb 3.2 oz)     BMI Readings from Last 3 Encounters:   08/12/21 47.88 kg/m?   06/17/21 47.47 kg/m?   05/13/21 49.54 kg/m?      Smoking Social History     Tobacco Use   Smoking Status Never   Smokeless Tobacco Never      Lipid Profile Cholesterol   Date Value Ref Range Status   06/27/2021 114  Final     HDL   Date Value Ref Range Status   06/27/2021 40  Final     LDL   Date Value Ref Range Status   06/27/2021 61  Final     Triglycerides   Date Value Ref Range Status   06/27/2021 69  Final      Blood Sugar No results found for: HGBA1C  Glucose   Date Value Ref Range Status   06/27/2021 109 (H) 70 - 105 Final   05/05/2021 108 (H) 70 - 105 Final   04/22/2021 91 70 - 100 MG/DL Final          Problems Addressed Today  Encounter Diagnoses   Name Primary?   ? Coronary artery disease with angina pectoris, unspecified vessel or lesion type, unspecified whether native or transplanted heart (HCC) Yes   ? Ischemic cardiomyopathy    ? Neuropathy    ? Chronic fatigue syndrome    ? Class 3 severe obesity with body mass index (BMI) of 50.0 to 59.9 in adult, unspecified obesity type, unspecified whether serious comorbidity present (HCC)    ? Metabolic syndrome    ? Cramps of lower extremity    ? Hyperlipidemia, unspecified hyperlipidemia type    ? Other cardiomyopathies (HCC)        Assessment and Plan   Mr. Sperl reports chronic left pectoral chest discomfort which sounds musculoskeletal.  It is not very bothersome to him and persists for hours at a time.  It is markedly dissimilar from the classic angina he experienced prior to coronary stenting.  Today, I reviewed his coronary angiography, echocardiogram and 2 prior stress test.  ConnorGeiler also believes that his current chronic discomfort is musculoskeletal and he does not want to pursue any additional stress testing at this time.  His blood pressure is well controlled today.  He does not have an explanation for why his blood pressure was elevated at cardiac rehab on 08/08/2021.  His first stress test on April 09, 2021 showed an ejection fraction of 36%.  His recent echocardiogram showed an estimated ejection fraction of 60%.  It appears that his ejection fraction was initially below 40% and now is above 40%.  His diagnosis, therefore, may be best characterized as heart failure with improved ejection fraction.  Therefore, no changes were made in his medical regimen today.  He plans to follow-up with Dr. Harvel Ricks in cardiovascular clinic with [facility]. The total time spent during this interview and exam was 30 minutes.         Current Medications (including today's revisions)  ? aspirin EC (ASPIR-LOW) 81 mg tablet Take one tablet by mouth daily. Take with food.   ? atorvastatin (LIPITOR) 80 mg tablet Take one tablet by mouth daily.   ? empagliflozin (JARDIANCE) 10 mg tablet Take one tablet by mouth daily.   ? levothyroxine (SYNTHROID) 175 mcg tablet Take 175 mcg by mouth daily.   ? losartan (COZAAR) 50 mg tablet Take one tablet by mouth daily.   ? meloxicam (MOBIC) 15 mg tablet Take 15 mg by mouth daily.   ? metoprolol succinate XL (TOPROL XL) 25 mg extended release tablet Take 1.5 tablets by mouth daily.   ? multivit-min/folic/vit K/lycop (ONE-A-DAY MEN'S MULTIVITAMIN PO) Take 1 tablet by mouth daily.   ? naproxen (NAPROSYN) 500 mg tablet Take 500 mg by mouth as Needed.   ? potassium gluconate 600 mg (99 mg) tab Take 650 mg by mouth daily.   ? prasugreL (EFFIENT) 10 mg tablet Take one tablet by mouth daily.   ? predniSONE (DELTASONE) 20 mg tablet Take three tablets by mouth as directed. Take 60mg  the day before and the morning of the procedure   ? primidone (MYSOLINE) 50 mg tablet Take 150 mg by mouth daily.   ? spironolactone (ALDACTONE) 25 mg tablet Take one tablet by mouth daily. Take with food.

## 2021-09-05 ENCOUNTER — Encounter: Admit: 2021-09-05 | Discharge: 2021-09-05 | Payer: No Typology Code available for payment source

## 2021-09-05 NOTE — Telephone Encounter
Dr. Burnadette Pop called to discuss pt's symptomatic PVC's, bradycardial and low BP.  Cardiac rehab would not let him exercise because he was having trouble with cp with PVC's.  Dr.  Chilton Si is reluctant to increase Beta blocker with Bradycardia.  Will Schedule with [facility] to determine testing and medication adjustments.

## 2021-09-09 ENCOUNTER — Encounter: Admit: 2021-09-09 | Discharge: 2021-09-09 | Payer: No Typology Code available for payment source

## 2021-09-09 ENCOUNTER — Ambulatory Visit: Admit: 2021-09-09 | Discharge: 2021-09-09 | Payer: No Typology Code available for payment source

## 2021-09-09 ENCOUNTER — Ambulatory Visit: Admit: 2021-09-09 | Discharge: 2021-09-10 | Payer: No Typology Code available for payment source

## 2021-09-09 DIAGNOSIS — G9332 Chronic fatigue syndrome: Secondary | ICD-10-CM

## 2021-09-09 DIAGNOSIS — I255 Ischemic cardiomyopathy: Secondary | ICD-10-CM

## 2021-09-09 DIAGNOSIS — R5381 Other malaise: Secondary | ICD-10-CM

## 2021-09-09 DIAGNOSIS — I4729 NSVT (nonsustained ventricular tachycardia): Secondary | ICD-10-CM

## 2021-09-09 DIAGNOSIS — E8881 Metabolic syndrome: Secondary | ICD-10-CM

## 2021-09-09 DIAGNOSIS — R079 Chest pain, unspecified: Secondary | ICD-10-CM

## 2021-09-09 DIAGNOSIS — E785 Hyperlipidemia, unspecified: Secondary | ICD-10-CM

## 2021-09-09 DIAGNOSIS — T8859XA Other complications of anesthesia, initial encounter: Secondary | ICD-10-CM

## 2021-09-09 DIAGNOSIS — T148XXA Other injury of unspecified body region, initial encounter: Secondary | ICD-10-CM

## 2021-09-09 MED ORDER — METOPROLOL SUCCINATE 25 MG PO TB24
25 mg | ORAL_TABLET | Freq: Every day | ORAL | 1 refills | 90.00000 days | Status: AC
Start: 2021-09-09 — End: ?

## 2021-09-09 NOTE — Patient Instructions
Change Toprol xl to 25 mg daily  Will place heart monitor to reassess heart rhythm  Arrange for home sleep stud  Follow up 2 months

## 2021-09-09 NOTE — Progress Notes
Date of Service: 09/09/2021    Connor Wells is a 66 y.o. male.       HPI     Patient is a 66 year old Caucasian male past medical history of morbid obesity, chronic fatigue syndrome, hypothyroidism, who presented for abnormal ECG and abnormal stress test in September 2022.  At that time noted a lot of fatigue and activity intolerance.  Was not having specific chest pain and did not report significant palpitations.  Because the stress test showed decreased left ventricular ejection fraction and significant ischemia he underwent coronary angiograms with stenting to the left anterior descending artery.  Right radial artery approach was taken.  Post procedure had significant PVCs during recovery which led to placement of a 14-day Zio patch.  He showed frequent PVCs with short short burst of nonsustained VT.  Did have some symptoms associated with these rhythm events.  Initially struggled a lot with recovery with right arm pain and numbness.  Evaluation for complications from the procedure was negative.  That is essentially resolved at this time.  He has been on medications prescribed for the ischemic cardiomyopathy and post PCI medicine.  Continues to complain significantly about activity intolerance, general malaise and fatigue.  Notes, the headachy sensation is just constant.  He is not having bleeding or bruising issues.  Repeat nuclear stress test showed left ventricular ejection fraction greater than 45% with no findings for ischemia.  Echocardiogram reported preserved left ventricular ejection fraction with no other structural or functional issues.  Lipid profile significantly improved.  Heart rate has been at times close to 30 to 40 bpm but his response to exercise with cardiac rehab is been appropriate.  We do see the PVCs and he does talk about episodes where he can feel his heart, fluttering and knowing his smart watch showing him his heart rate was well above 100 bpm.  He has not had syncope or near syncopal episodes.  No bleeding concerns.         Vitals:    09/09/21 0929   BP: 114/68   BP Source: Arm, Left Upper   Pulse: 58   SpO2: 97%   O2 Device: None (Room air)   PainSc: Zero   Weight: (!) 160.6 kg (354 lb)   Height: 182.9 cm (6')     Body mass index is 48.01 kg/m?Marland Kitchen     Past Medical History  Patient Active Problem List    Diagnosis Date Noted   ? Coronary artery disease with angina pectoris, unspecified vessel or lesion type, unspecified whether native or transplanted heart (HCC) 08/12/2021   ? Abnormal stress test 04/21/2021   ? Cardiomyopathy, unspecified (HCC) 04/21/2021   ? Coronary artery disease due to lipid rich plaque 04/21/2021     LHC 04/21/21: PCI LAD      ? Chronic fatigue syndrome 04/15/2021   ? Hypothyroidism 04/15/2021   ? Obesity 04/15/2021   ? Chest pain 04/15/2021     04/09/21 Regadenoson MPI:  Abnormal.   Moderate size, severe intensity reversible perfusion defect involving the basal anterior, mid anterior and mid anterolateral segments with extension into the the apical lateral portions of the left ventricle.  There is corresponding hypokinesis  Global LV systolic function is moderately impaired.  LVEF 36%.  LV is mildly dilated at rest without any definite transient ischemic dilatation.  Intermediate risk.     ? Hyperlipemia 04/15/2021   ? 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 Unspecified closed fracture of carpal bone 04/01/2012   ? Carpal tunnel syndrome 04/01/2012         Review of Systems   Constitutional: Positive for malaise/fatigue.   HENT: Negative.    Eyes: Negative.    Cardiovascular: Positive for chest pain and irregular heartbeat.   Respiratory: Negative.    Endocrine: Negative.    Hematologic/Lymphatic: Negative.    Skin: Negative.    Musculoskeletal: Negative.    Gastrointestinal: Negative.    Genitourinary: Negative.    Neurological: Negative.    Psychiatric/Behavioral: Negative.    Allergic/Immunologic: Negative.        Physical Exam  Patient is in no acute distress but expresses general frustration, obese male  Pupils are equal react without scleral injection  Neck is enlarged with no significant pannus or jugular venous abnormalities.  No bruits are appreciated  Lungs are clear to auscultation  Heart S1, S2 is normal.  I do not appreciate significant murmurs, clicks, or gallops  Pulses are 1-2+ for most part regular.  Very occasional irregularity is appreciated  No significant peripheral edema with normal skin turgor  Abdomen is obese but soft and nontender    Cardiovascular Studies      Cardiovascular Health Factors  Vitals BP Readings from Last 3 Encounters:   09/09/21 114/68   08/12/21 110/70   06/17/21 105/55     Wt Readings from Last 3 Encounters:   09/09/21 (!) 160.6 kg (354 lb)   08/12/21 (!) 160.1 kg (353 lb)   06/17/21 (!) 158.8 kg (350 lb)     BMI Readings from Last 3 Encounters:   09/09/21 48.01 kg/m?   08/12/21 47.88 kg/m?   06/17/21 47.47 kg/m?      Smoking Social History     Tobacco Use   Smoking Status Never   Smokeless Tobacco Never      Lipid Profile Cholesterol   Date Value Ref Range Status   06/27/2021 114  Final     HDL   Date Value Ref Range Status   06/27/2021 40  Final     LDL   Date Value Ref Range Status   06/27/2021 61  Final     Triglycerides   Date Value Ref Range Status   06/27/2021 69  Final      Blood Sugar No results found for: HGBA1C  Glucose   Date Value Ref Range Status   06/27/2021 109 (H) 70 - 105 Final   05/05/2021 108 (H) 70 - 105 Final   04/22/2021 91 70 - 100 MG/DL Final          Problems Addressed Today  Encounter Diagnoses   Name Primary?   ? Ischemic cardiomyopathy Yes   ? Class 3 severe obesity with body mass index (BMI) of 50.0 to 59.9 in adult, unspecified obesity type, unspecified whether serious comorbidity present (HCC)    ? Metabolic syndrome    ? Chronic fatigue syndrome    ? NSVT (nonsustained ventricular tachycardia)    ? Malaise and fatigue        Assessment and Plan     Cardiovascular testing as shown improvement in heart function with no findings for significant ischemia since his percutaneous coronary intervention.  Heart rate and blood pressure actually in goal ranges.  He continues to demonstrate the PVCs with findings that suggest possible nonsustained arrhythmia.  Because of his ongoing symptoms and results of the previous test we  will plan for repeat 7-day Zio patch to further assess heart rhythm and frequency of the PVCs as well as the potential for nonsustained VT.  Because his heart rate is lower when it is lower he feels poorly we will go ahead and just make the Toprol-XL 25 mg daily.  Did discuss and patient is agreeable to doing home sleep study at this time,  Is very reluctant for potential treatment of sleep apnea if it is identified.  No other changes to therapy at this time.  Plan for follow-up will be in 2 to 3 months, we will contact him about the results as they are available         Current Medications (including today's revisions)  ? aspirin EC (ASPIR-LOW) 81 mg tablet Take one tablet by mouth daily. Take with food.   ? atorvastatin (LIPITOR) 80 mg tablet Take one tablet by mouth daily.   ? empagliflozin (JARDIANCE) 10 mg tablet Take one tablet by mouth daily.   ? levothyroxine (SYNTHROID) 175 mcg tablet Take 175 mcg by mouth daily.   ? meloxicam (MOBIC) 15 mg tablet Take 15 mg by mouth daily.   ? metoprolol succinate XL (TOPROL XL) 25 mg extended release tablet Take one tablet by mouth daily.   ? multivit-min/folic/vit K/lycop (ONE-A-DAY MEN'S MULTIVITAMIN PO) Take 1 tablet by mouth daily.   ? naproxen (NAPROSYN) 500 mg tablet Take 500 mg by mouth as Needed.   ? potassium gluconate 600 mg (99 mg) tab Take 650 mg by mouth daily.   ? prasugreL (EFFIENT) 10 mg tablet Take one tablet by mouth daily.   ? primidone (MYSOLINE) 50 mg tablet Take 150 mg by mouth daily.   ? sacubitriL-valsartan (ENTRESTO) 49-51 mg tablet Take one tablet by mouth twice daily.   ? spironolactone (ALDACTONE) 25 mg tablet Take one tablet by mouth daily. Take with food.

## 2021-09-09 NOTE — Progress Notes
Ambulatory (External) Cardiac Monitor Enrollment Record    Brand: [employer])  Serial Number: [id number]  Location where monitor was placed:  Clinic  Start Time and Date: 09/09/21 11:35 AM  Patient instructed to contact company phone number on the monitor box with questions regarding billing, placement, troubleshooting.     Connor Wells

## 2021-10-11 ENCOUNTER — Encounter: Admit: 2021-10-11 | Discharge: 2021-10-11 | Payer: No Typology Code available for payment source

## 2021-10-11 MED ORDER — ASPIRIN 81 MG PO TBEC
81 mg | ORAL_TABLET | Freq: Every day | ORAL | 5 refills
Start: 2021-10-11 — End: ?

## 2021-10-16 ENCOUNTER — Encounter: Admit: 2021-10-16 | Discharge: 2021-10-16 | Payer: No Typology Code available for payment source

## 2021-10-16 MED ORDER — VICTOZA 2-PAK 0.6 MG/0.1 ML (18 MG/3 ML) SC PNIJ
1.2 mg | Freq: Every day | SUBCUTANEOUS | 11 refills | Status: AC
Start: 2021-10-16 — End: ?

## 2021-10-16 NOTE — Progress Notes
Date of Service: 10/16/2021    Connor Wells is a 66 y.o. male.       HPI     Patient is a 66 year old Caucasian male past medical history of metabolic syndrome, severe obesity causing and related to multiple comorbidities, coronary artery disease with ischemic cardiomyopathy, nonsustained VT and PVCs, who presents for follow-up related to chronic fatigue, activity intolerance, palpitations, and overall limited functional status.  Patient is in and out of cardiac rehab related to how poorly he feels at times.  States a lot of times mornings are the most difficult.  Does fall asleep in his recliner frequently at night and then has variable sleep patterns throughout the night.  Denies specific orthopnea or PND symptoms.  Does have occasional nocturia.  Reports feels better with the decreased dose of the metoprolol succinate.  Repeat 7-day rhythm monitor showed occasional PVCs with grouped PVCs out to about 3-4 beats consecutively.  Rate during those episodes are less than 170 bpm.  There is likely some aberrancy involved when I review those sections of his rhythm.  He is not having chest pain.  Numbness and tingling in his right arm after the radial heart cath have improved or resolved.  Is very frustrated with his weight and his generally poor functional status.  Is not having bleeding or bruising concerns.  Reports otherwise good compliance with the medications.         Vitals:    10/16/21 0829   BP: 116/68   BP Source: Arm, Left Upper   Pulse: 61   SpO2: 96%   O2 Device: None (Room air)   PainSc: Zero   Weight: (!) 159.9 kg (352 lb 9.6 oz)   Height: 182.9 cm (6')     Body mass index is 47.82 kg/m?Marland Kitchen     Past Medical History  Patient Active Problem List    Diagnosis Date Noted   ? Coronary artery disease with angina pectoris, unspecified vessel or lesion type, unspecified whether native or transplanted heart (HCC) 08/12/2021   ? Abnormal stress test 04/21/2021   ? Cardiomyopathy, unspecified (HCC) 04/21/2021   ? Coronary artery disease due to lipid rich plaque 04/21/2021     LHC 04/21/21: PCI LAD      ? Chronic fatigue syndrome 04/15/2021   ? Hypothyroidism 04/15/2021   ? Obesity 04/15/2021   ? Chest pain 04/15/2021     04/09/21 Regadenoson MPI:  Abnormal.   Moderate size, severe intensity reversible perfusion defect involving the basal anterior, mid anterior and mid anterolateral segments with extension into the the apical lateral portions of the left ventricle.  There is corresponding hypokinesis  Global LV systolic function is moderately impaired.  LVEF 36%.  LV is mildly dilated at rest without any definite transient ischemic dilatation.  Intermediate risk.     ? Hyperlipemia 04/15/2021   ? 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 Unspecified closed fracture of carpal bone 04/01/2012   ? Carpal tunnel syndrome 04/01/2012         Review of Systems   Constitutional: Positive for malaise/fatigue.   HENT: Negative.    Eyes: Negative.    Cardiovascular: Negative.    Respiratory: Negative.    Endocrine: Negative.    Hematologic/Lymphatic: Negative.    Skin: Negative.    Musculoskeletal: Negative.    Gastrointestinal: Negative.    Genitourinary: Negative.    Neurological:  Negative.    Psychiatric/Behavioral: Negative.    Allergic/Immunologic: Negative.        Physical Exam  Pleasant, well kept, morbidly obese gentleman  Does appear somewhat tired  Pupils are equal reactive without scleral injection  Neck is supple with normal carotid upstroke and no bruits.  No obvious jugular venous abnormalities or masses  Heart S1, S2 that are normal.  No murmurs, clicks, gallops  Lungs are clear to auscultation with symmetric findings and good rise and fall the chest  Abdomen is obese but soft and nontender  Pulse are 2+, regular, and symmetric bilaterally radial as well as pedal locations  Patient has nonpitting 2+ edema at the lower legs without significant skin changes and normal hair growth    Cardiovascular Studies      Cardiovascular Health Factors  Vitals BP Readings from Last 3 Encounters:   10/16/21 116/68   09/09/21 114/68   08/12/21 110/70     Wt Readings from Last 3 Encounters:   10/16/21 (!) 159.9 kg (352 lb 9.6 oz)   09/09/21 (!) 160.6 kg (354 lb)   08/12/21 (!) 160.1 kg (353 lb)     BMI Readings from Last 3 Encounters:   10/16/21 47.82 kg/m?   09/09/21 48.01 kg/m?   08/12/21 47.88 kg/m?      Smoking Social History     Tobacco Use   Smoking Status Never   Smokeless Tobacco Never      Lipid Profile Cholesterol   Date Value Ref Range Status   06/27/2021 114  Final     HDL   Date Value Ref Range Status   06/27/2021 40  Final     LDL   Date Value Ref Range Status   06/27/2021 61  Final     Triglycerides   Date Value Ref Range Status   06/27/2021 69  Final      Blood Sugar No results found for: HGBA1C  Glucose   Date Value Ref Range Status   06/27/2021 109 (H) 70 - 105 Final   05/05/2021 108 (H) 70 - 105 Final   04/22/2021 91 70 - 100 MG/DL Final          Problems Addressed Today  Encounter Diagnoses   Name Primary?   ? Chronic fatigue syndrome Yes   ? Metabolic syndrome    ? Malaise and fatigue    ? Coronary artery disease with angina pectoris, unspecified vessel or lesion type, unspecified whether native or transplanted heart (HCC)    ? Ischemic cardiomyopathy    ? PVC (premature ventricular contraction)    ? Class 3 severe obesity with serious comorbidity and body mass index (BMI) of 45.0 to 49.9 in adult, unspecified obesity type (HCC)        Assessment and Plan     Based upon post-PCI review his left ventricular ejection fraction by his nuclear stress test was 45% and up to 60% when we looked at it by echo.  Does not have significant valvular abnormalities or right-sided abnormalities detected.  Continue to work through improvement of his activity tolerance.  Rhythm is maintained on current therapy. Did have significant rediscussion regarding evaluation for potential sleep apnea.  States he only recently was contacted to arrange for the home study.  He is willing to go through with the test and determine if therapy is right for him for sleep apnea after that.  Also had discussion about his weight and potential options for management there.  Will initiate Victoza  subcutaneously for better blood sugar control, metabolic syndrome control, and potential weight loss.  Patient can contact me with any questions or concerns.  Routine follow-up can be in approximately 4 months.         Current Medications (including today's revisions)  ? aspirin EC (ASPIR-LOW) 81 mg tablet Take one tablet by mouth daily. Take with food.   ? atorvastatin (LIPITOR) 80 mg tablet Take one tablet by mouth daily.   ? empagliflozin (JARDIANCE) 10 mg tablet Take one tablet by mouth daily.   ? levothyroxine (SYNTHROID) 175 mcg tablet Take one tablet by mouth daily.   ? liraglutide (VICTOZA 2-PAK) 0.6 mg/0.1 mL (18 mg/3 mL) injection pen Inject 1.2 mg under the skin daily.   ? meloxicam (MOBIC) 15 mg tablet Take one tablet by mouth daily.   ? metoprolol succinate XL (TOPROL XL) 25 mg extended release tablet Take one tablet by mouth daily.   ? multivit-min/folic/vit K/lycop (ONE-A-DAY MEN'S MULTIVITAMIN PO) Take 1 tablet by mouth daily.   ? naproxen (NAPROSYN) 500 mg tablet Take one tablet by mouth as Needed.   ? potassium gluconate 600 mg (99 mg) tab Take 1.0833 tablets by mouth daily.   ? prasugreL (EFFIENT) 10 mg tablet Take one tablet by mouth daily.   ? primidone (MYSOLINE) 50 mg tablet Take three tablets by mouth daily.   ? sacubitriL-valsartan (ENTRESTO) 49-51 mg tablet Take one tablet by mouth twice daily.   ? spironolactone (ALDACTONE) 25 mg tablet Take one tablet by mouth daily. Take with food.

## 2021-10-29 ENCOUNTER — Encounter: Admit: 2021-10-29 | Discharge: 2021-10-29 | Payer: No Typology Code available for payment source

## 2021-10-29 NOTE — Progress Notes
Prior auth sent through covermymeds for victoza- awaiting decision

## 2021-11-02 ENCOUNTER — Encounter: Admit: 2021-11-02 | Discharge: 2021-11-02 | Payer: No Typology Code available for payment source

## 2021-11-02 MED ORDER — SPIRONOLACTONE 25 MG PO TAB
25 mg | ORAL_TABLET | Freq: Every day | ORAL | 1 refills
Start: 2021-11-02 — End: ?

## 2021-11-20 ENCOUNTER — Encounter: Admit: 2021-11-20 | Discharge: 2021-11-20 | Payer: No Typology Code available for payment source

## 2021-11-20 NOTE — Telephone Encounter
Outgoing call to offer an appointment on 11/21/21 with Dr Andria Meuse. Patient unable to make it. He will call back to reschedule.

## 2021-11-21 ENCOUNTER — Encounter: Admit: 2021-11-21 | Discharge: 2021-11-21 | Payer: No Typology Code available for payment source

## 2021-11-21 DIAGNOSIS — G4733 Obstructive sleep apnea (adult) (pediatric): Secondary | ICD-10-CM

## 2021-11-28 ENCOUNTER — Encounter: Admit: 2021-11-28 | Discharge: 2021-11-28 | Payer: No Typology Code available for payment source

## 2021-11-28 MED ORDER — ENTRESTO 49-51 MG PO TAB
ORAL_TABLET | 11 refills | Status: AC
Start: 2021-11-28 — End: ?

## 2021-12-22 ENCOUNTER — Encounter: Admit: 2021-12-22 | Discharge: 2021-12-22 | Payer: No Typology Code available for payment source

## 2021-12-22 NOTE — Telephone Encounter
Received a phone call from Dr. Thomasene Lot nurse with concerns of the patient reports having episodes of low heart rates and feeling fatigued. Dr. Chilton Si is concerned the patient might need metoprolol dosage adjusted, current dosage metoprolol succ 25 mg daily . The patient was seen in their office today and patient complains of fatigue and episodes of heart rates in the 30's. Patient's VS today BP 110/72, HR 80 and O2 94%. The nurse reports the patient was evaluated and released  from the [facility] for shortness of breath, diaphoretic and slight jaw pain that began during cardiac rehab. His ER cardiac workup was negative. The patient has been following with Dr. Harvel Ricks for similar symptoms. He had a cardiac monitor in January 2023 and in November 2022 a stress test and echo.     Will route to Dr. Harvel Ricks for her review and recommendations. Patient has a scheduled appointment with her on 02/03/22.

## 2022-01-27 ENCOUNTER — Encounter: Admit: 2022-01-27 | Discharge: 2022-01-27 | Payer: No Typology Code available for payment source

## 2022-02-03 ENCOUNTER — Encounter: Admit: 2022-02-03 | Discharge: 2022-02-03 | Payer: No Typology Code available for payment source

## 2022-02-03 ENCOUNTER — Ambulatory Visit: Admit: 2022-02-03 | Discharge: 2022-02-04 | Payer: No Typology Code available for payment source

## 2022-02-03 DIAGNOSIS — E785 Hyperlipidemia, unspecified: Secondary | ICD-10-CM

## 2022-02-03 DIAGNOSIS — T8859XA Other complications of anesthesia, initial encounter: Secondary | ICD-10-CM

## 2022-02-03 DIAGNOSIS — R079 Chest pain, unspecified: Secondary | ICD-10-CM

## 2022-02-03 DIAGNOSIS — G629 Polyneuropathy, unspecified: Secondary | ICD-10-CM

## 2022-02-03 DIAGNOSIS — I255 Ischemic cardiomyopathy: Secondary | ICD-10-CM

## 2022-02-03 DIAGNOSIS — I493 Ventricular premature depolarization: Secondary | ICD-10-CM

## 2022-02-03 DIAGNOSIS — G9332 Chronic fatigue syndrome: Secondary | ICD-10-CM

## 2022-02-03 DIAGNOSIS — G4733 Obstructive sleep apnea (adult) (pediatric): Secondary | ICD-10-CM

## 2022-02-03 DIAGNOSIS — I4729 NSVT (nonsustained ventricular tachycardia) (HCC): Secondary | ICD-10-CM

## 2022-02-03 DIAGNOSIS — E8881 Metabolic syndrome: Secondary | ICD-10-CM

## 2022-02-03 DIAGNOSIS — T148XXA Other injury of unspecified body region, initial encounter: Secondary | ICD-10-CM

## 2022-02-03 MED ORDER — CAPSAICIN 0.1 % TP CREA
Freq: Three times a day (TID) | TOPICAL | 1 refills | Status: AC | PRN
Start: 2022-02-03 — End: ?

## 2022-02-03 NOTE — Progress Notes
Date of Service: 02/03/2022    Connor Wells is a 66 y.o. male.       HPI     Patient is now a 66 year old gentleman past medical history of metabolic syndrome with severe sleep apnea, obesity, hyperlipidemia, and prediabetes mellitus who has known coronary artery disease and chronic PVCs.  Baseline ECG has a right bundle branch block.  Did undergo left heart catheterization in September 2022 with PCI to the LAD.  His left ventricular ejection fraction has recovered from 40% to near 60%.  Had a lot of complications at the radial location on his right arm.  Those are resolved and clinically has struggled at times with being able to participate in cardiac rehab because of symptoms.  Reports he still has good days and bad days and did going to the local emergency room in early May.  Evaluation by blood work, chest x-ray, and ECG did not reveal significant abnormality.  Hemodynamics were stable therefore he was discharged and ultimately is been basically himself.  We did have him undergo a home sleep study in March which showed severe sleep apnea and significant hypoxia.  Despite this knowledge he does not want to use CPAP at this time.  Has done well with the Victoza and currently has lost greater than 20 pounds and feels better with his activity tolerance there.  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  No identified bleeding or findings for anemia have occurred.  Does struggle a lot with some neuropathy symptoms at times will get tingling in both his arms and his legs but states that the pretibial location in his right leg is the area that seems to bother him the most.  No claudication or rest pain type symptoms.       Vitals:    02/03/22 0911   BP: 104/60   BP Source: Arm, Left Upper   Pulse: 74   SpO2: 97%   O2 Device: None (Room air)   PainSc: Zero   Weight: (!) 150.7 kg (332 lb 3.2 oz)   Height: 182.9 cm (6') Body mass index is 45.05 kg/m?Marland Kitchen     Past Medical History  Patient Active Problem List    Diagnosis Date Noted   ? Coronary artery disease with angina pectoris, unspecified vessel or lesion type, unspecified whether native or transplanted heart (HCC) 08/12/2021   ? Abnormal stress test 04/21/2021   ? Cardiomyopathy, unspecified (HCC) 04/21/2021   ? Coronary artery disease due to lipid rich plaque 04/21/2021     LHC 04/21/21: PCI LAD      ? Chronic fatigue syndrome 04/15/2021   ? Hypothyroidism 04/15/2021   ? Obesity 04/15/2021   ? Chest pain 04/15/2021     04/09/21 Regadenoson MPI:  Abnormal.   Moderate size, severe intensity reversible perfusion defect involving the basal anterior, mid anterior and mid anterolateral segments with extension into the the apical lateral portions of the left ventricle.  There is corresponding hypokinesis  Global LV systolic function is moderately impaired.  LVEF 36%.  LV is mildly dilated at rest without any definite transient ischemic dilatation.  Intermediate risk.     ? Hyperlipemia 04/15/2021   ? 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 Unspecified closed fracture of carpal bone 04/01/2012   ?  Carpal tunnel syndrome 04/01/2012         Review of Systems   Constitutional: Negative.   HENT: Negative.    Eyes: Negative.    Cardiovascular: Negative.    Respiratory: Negative.    Endocrine: Negative.    Hematologic/Lymphatic: Negative.    Skin: Negative.    Musculoskeletal: Negative.    Gastrointestinal: Negative.    Genitourinary: Negative.    Neurological: Negative.    Psychiatric/Behavioral: Negative.    Allergic/Immunologic: Negative.        Physical Exam  Pleasant gentleman that is very well-kept.  Appears obese.  Clean-shaven  Pupils Rigg react without scleral injection  Neck is enlarged but no pannus.  Difficult determine landmarks.  No bruits or jugular venous abnormalities  Chest is symmetric with lungs are clear to auscultation  Abdomen is obese with superficial green and yellow bruising that is variable.  Heart S1, S2 that are normal.  No murmurs, clicks, or gallops  Pulse are 2+, regular, and symmetric bilaterally radial as well as dorsalis pedis and posterior tibial location  No significant peripheral edema normal hair growth throughout the lower legs with rapid reperfusion symmetric muscle tone and good skin turgor    Cardiovascular Studies  Reviewed records from his recent emergency department visit    Cardiovascular Health Factors  Vitals BP Readings from Last 3 Encounters:   02/03/22 104/60   10/16/21 116/68   09/09/21 114/68     Wt Readings from Last 3 Encounters:   02/03/22 (!) 150.7 kg (332 lb 3.2 oz)   10/16/21 (!) 159.9 kg (352 lb 9.6 oz)   09/09/21 (!) 160.6 kg (354 lb)     BMI Readings from Last 3 Encounters:   02/03/22 45.05 kg/m?   10/16/21 47.82 kg/m?   09/09/21 48.01 kg/m?      Smoking Social History     Tobacco Use   Smoking Status Never   Smokeless Tobacco Never   Vaping Use   ? Vaping Use: Never used      Lipid Profile Cholesterol   Date Value Ref Range Status   06/27/2021 114  Final     HDL   Date Value Ref Range Status   06/27/2021 40  Final     LDL   Date Value Ref Range Status   06/27/2021 61  Final     Triglycerides   Date Value Ref Range Status   06/27/2021 69  Final      Blood Sugar Hemoglobin A1C   Date Value Ref Range Status   08/22/2021 5.4  Final     Glucose   Date Value Ref Range Status   12/11/2021 101  Final   06/27/2021 109 (H) 70 - 105 Final   05/05/2021 108 (H) 70 - 105 Final          Problems Addressed Today  Encounter Diagnoses   Name Primary?   ? Chronic fatigue syndrome Yes   ? Metabolic syndrome    ? PVC (premature ventricular contraction)    ? NSVT (nonsustained ventricular tachycardia) (HCC)    ? Class 3 severe obesity with body mass index (BMI) of 50.0 to 59.9 in adult, unspecified obesity type, unspecified whether serious comorbidity present (HCC)    ? Neuropathy    ? Ischemic cardiomyopathy    ? OSA (obstructive sleep apnea)        Assessment and Plan     His hemodynamics are clinically doing well.  He has been compliant and tolerant with his  medications with recovery of his left ventricular ejection fraction.  Is not having anginal type symptoms at this time.  Did have discussion about treating his sleep apnea at this point he is just not willing to initiate CPAP therapy.  Has done great as far as weight loss of greater than 20 pounds at this time.  Reports he like to lose even 100 pounds more.  Has been maintained with the cardiac rehab and working harder on his diet.  Because of his more significant neuropathy symptoms we will trial him on some capsaicin cream that he can apply up to 3 times a day to help with symptoms to the affected areas.  He should contact me any questions or concerns.  Would make follow-up at approximate 6 months.         Current Medications (including today's revisions)  ? aspirin EC (ASPIR-LOW) 81 mg tablet Take one tablet by mouth daily. Take with food.   ? atorvastatin (LIPITOR) 80 mg tablet Take one tablet by mouth daily.   ? capsaicin (CAPZASIN-HP) 0.1 % topical cream Apply  topically to affected area three times daily as needed.   ? empagliflozin (JARDIANCE) 10 mg tablet Take one tablet by mouth daily.   ? ENTRESTO 49-51 mg tablet TAKE 1 TABLET BY MOUTH TWICE A DAY   ? levothyroxine (SYNTHROID) 175 mcg tablet Take one tablet by mouth daily.   ? liraglutide (VICTOZA 2-PAK) 0.6 mg/0.1 mL (18 mg/3 mL) injection pen Inject 1.2 mg under the skin daily.   ? meloxicam (MOBIC) 15 mg tablet Take one tablet by mouth daily.   ? metoprolol succinate XL (TOPROL XL) 25 mg extended release tablet Take one tablet by mouth daily.   ? multivit-min/folic/vit K/lycop (ONE-A-DAY MEN'S MULTIVITAMIN PO) Take 1 tablet by mouth daily.   ? naproxen (NAPROSYN) 500 mg tablet Take one tablet by mouth as Needed. ? potassium gluconate 600 mg (99 mg) tab Take 1.0833 tablets by mouth daily.   ? prasugreL (EFFIENT) 10 mg tablet Take one tablet by mouth daily.   ? primidone (MYSOLINE) 50 mg tablet Take three tablets by mouth daily.   ? spironolactone (ALDACTONE) 25 mg tablet TAKE ONE TABLET BY MOUTH DAILY. TAKE WITH FOOD.

## 2022-02-07 ENCOUNTER — Encounter: Admit: 2022-02-07 | Discharge: 2022-02-07 | Payer: No Typology Code available for payment source

## 2022-02-07 MED ORDER — JARDIANCE 10 MG PO TAB
ORAL_TABLET | 3 refills
Start: 2022-02-07 — End: ?

## 2022-02-07 MED ORDER — ATORVASTATIN 80 MG PO TAB
ORAL_TABLET | 3 refills
Start: 2022-02-07 — End: ?

## 2022-02-07 MED ORDER — PRASUGREL 10 MG PO TAB
ORAL_TABLET | 3 refills
Start: 2022-02-07 — End: ?

## 2022-04-03 ENCOUNTER — Encounter: Admit: 2022-04-03 | Discharge: 2022-04-03 | Payer: No Typology Code available for payment source

## 2022-04-03 MED ORDER — METOPROLOL SUCCINATE 25 MG PO TB24
25 mg | ORAL_TABLET | Freq: Every day | ORAL | 1 refills | 90.00000 days | Status: AC
Start: 2022-04-03 — End: ?

## 2022-05-12 ENCOUNTER — Encounter: Admit: 2022-05-12 | Discharge: 2022-05-12 | Payer: No Typology Code available for payment source

## 2022-07-16 ENCOUNTER — Encounter: Admit: 2022-07-16 | Discharge: 2022-07-16 | Payer: No Typology Code available for payment source

## 2022-07-16 ENCOUNTER — Ambulatory Visit: Admit: 2022-07-16 | Discharge: 2022-07-17 | Payer: No Typology Code available for payment source

## 2022-07-16 DIAGNOSIS — E785 Hyperlipidemia, unspecified: Secondary | ICD-10-CM

## 2022-07-16 DIAGNOSIS — G4733 Obstructive sleep apnea (adult) (pediatric): Secondary | ICD-10-CM

## 2022-07-16 DIAGNOSIS — I25119 Atherosclerotic heart disease of native coronary artery with unspecified angina pectoris: Secondary | ICD-10-CM

## 2022-07-16 DIAGNOSIS — T148XXA Other injury of unspecified body region, initial encounter: Secondary | ICD-10-CM

## 2022-07-16 DIAGNOSIS — T8859XA Other complications of anesthesia, initial encounter: Secondary | ICD-10-CM

## 2022-07-16 DIAGNOSIS — I493 Ventricular premature depolarization: Secondary | ICD-10-CM

## 2022-07-16 DIAGNOSIS — I255 Ischemic cardiomyopathy: Secondary | ICD-10-CM

## 2022-07-16 DIAGNOSIS — E8881 Metabolic syndrome: Secondary | ICD-10-CM

## 2022-07-16 DIAGNOSIS — R079 Chest pain, unspecified: Secondary | ICD-10-CM

## 2022-07-16 DIAGNOSIS — R5381 Other malaise: Secondary | ICD-10-CM

## 2022-07-16 MED ORDER — VICTOZA 2-PAK 0.6 MG/0.1 ML (18 MG/3 ML) SC PNIJ
1.8 mg | Freq: Every day | SUBCUTANEOUS | 11 refills | Status: AC
Start: 2022-07-16 — End: ?

## 2022-07-16 NOTE — Progress Notes
Date of Service: 07/16/2022    Connor Wells is a 66 y.o. male.       HPI     Patient is a 66 year old Caucasian male past medical history of metabolic syndrome manifest by morbid obesity, obstructive sleep apnea, chronic PVCs and previously identified nonsustained VT, coronary artery disease with history of ischemic cardiomyopathy with left ventricular ejection fraction estimated 40% before intervention and PCI to the left anterior descending artery in September 2022.  Initially was very slow to recover had complications at the radial access site and continued to have very and stable hemodynamics.  Has been out of cardiac rehab.  Was able to establish medical therapy with Victoza and other guideline directed medical therapy.  Clinically he has now lost almost 50 pounds was able to get through harvest and feel very well.  Did have a significant viral illness in the fall as well that he is now recovered.  Is not having chest pain, palpitations, or lightheadedness.  No orthopnea or PND.  No troubles with swelling.  Does have a goal of returning to cardiac rehab for more in the maintenance program moving forward.  He is otherwise compliant tolerant medication.  Opted not to initiate CPAP because he just did not feel comfortable with the machine.         Vitals:    07/16/22 0818   BP: 112/68   BP Source: Arm, Left Upper   Pulse: 74   SpO2: 98%   O2 Device: None (Room air)   PainSc: Zero   Weight: (!) 141.3 kg (311 lb 6.4 oz)   Height: 182.9 cm (6')     Body mass index is 42.23 kg/m?Marland Kitchen     Past Medical History  Patient Active Problem List    Diagnosis Date Noted    Coronary artery disease with angina pectoris, unspecified vessel or lesion type, unspecified whether native or transplanted heart (HCC) 08/12/2021    Abnormal stress test 04/21/2021    Cardiomyopathy, unspecified (HCC) 04/21/2021    Coronary artery disease due to lipid rich plaque 04/21/2021     LHC 04/21/21: PCI LAD       Chronic fatigue syndrome 04/15/2021 Hypothyroidism 04/15/2021    Obesity 04/15/2021    Chest pain 04/15/2021     04/09/21 Regadenoson MPI:  Abnormal.   Moderate size, severe intensity reversible perfusion defect involving the basal anterior, mid anterior and mid anterolateral segments with extension into the the apical lateral portions of the left ventricle.  There is corresponding hypokinesis  Global LV systolic function is moderately impaired.  LVEF 36%.  LV is mildly dilated at rest without any definite transient ischemic dilatation.  Intermediate risk.      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   HENT: Negative.     Eyes: Negative.    Cardiovascular: Negative.    Respiratory: Negative.     Endocrine: Negative.    Hematologic/Lymphatic: Negative.    Skin: Negative.    Musculoskeletal: Negative.    Gastrointestinal: Negative.    Genitourinary: Negative.    Neurological: Negative.    Psychiatric/Behavioral: Negative.     Allergic/Immunologic: Negative.        Physical Exam  Awake alert, well-nourished gentleman who is obese but no distress  Skin color is good  Does have a large neck circumference but clear landmarks with no jugular venous distention or carotid bruits  Pupils are equal reactive without scleral injection  Chest is symmetric and lungs clear to auscultation  Heart S1, S2 that are normal.  No murmurs, clicks, or gallops  Pulses are 2+, regular, and symmetric bilaterally radial as well as pedal location  He has normal hair growth at his legs with symmetric muscle tone and skin turgor with no edema  Abdomen is obese but soft    Cardiovascular Studies      Cardiovascular Health Factors  Vitals BP Readings from Last 3 Encounters:   07/16/22 112/68   02/03/22 104/60   10/16/21 116/68     Wt Readings from Last 3 Encounters:   07/16/22 (!) 141.3 kg (311 lb 6.4 oz)   02/03/22 (!) 150.7 kg (332 lb 3.2 oz)   10/16/21 (!) 159.9 kg (352 lb 9.6 oz)     BMI Readings from Last 3 Encounters:   07/16/22 42.23 kg/m?   02/03/22 45.05 kg/m?   10/16/21 47.82 kg/m?      Smoking Social History     Tobacco Use   Smoking Status Never   Smokeless Tobacco Never      Lipid Profile Cholesterol   Date Value Ref Range Status   06/27/2021 114  Final     HDL   Date Value Ref Range Status   06/27/2021 40  Final     LDL   Date Value Ref Range Status   06/27/2021 61  Final     Triglycerides   Date Value Ref Range Status   06/27/2021 69  Final      Blood Sugar Hemoglobin A1C   Date Value Ref Range Status   08/22/2021 5.4  Final     Glucose   Date Value Ref Range Status   12/11/2021 101  Final   06/27/2021 109 (H) 70 - 105 Final   05/05/2021 108 (H) 70 - 105 Final          Problems Addressed Today  Encounter Diagnoses   Name Primary?    Metabolic syndrome Yes    Class 3 severe obesity with body mass index (BMI) of 50.0 to 59.9 in adult, unspecified obesity type, unspecified whether serious comorbidity present (HCC)     PVC (premature ventricular contraction)     Malaise and fatigue     OSA (obstructive sleep apnea)     Ischemic cardiomyopathy     Coronary artery disease with angina pectoris, unspecified vessel or lesion type, unspecified whether native or transplanted heart (HCC)        Assessment and Plan     He has come a long ways since his coronary intervention last year.  Hemodynamics are much improved, his compliance and tolerance of the medications is also better has done well of the toes and has shown significant weight loss and is motivated for further weight loss.  At this time he is stable to resume cardiac rehab for his chronic coronary artery disease and previous PCI.  Left ventricular ejection fraction was recovered at last echocardiogram.  Rhythm is stable.  At this time we will increase his Victoza to 1.8 mg subcu daily at his next refill, had discussion with the patient and based upon his weight loss and recovery his LV function he could come off the Jardiance at that increase of dose with  the Victoza.  Does no longer need to be on the Effient and would have him be on aspirin daily instead is low in therapy.  Routine follow-up can be in approximate 6 months at which point would like to repeat echocardiogram to reassess his heart structure and function.         Current Medications (including today's revisions)   aspirin EC (ASPIR-LOW) 81 mg tablet Take one tablet by mouth daily. Take with food.    atorvastatin (LIPITOR) 80 mg tablet TAKE 1 TABLET BY MOUTH DAILY    capsaicin (CAPZASIN-HP) 0.1 % topical cream Apply  topically to affected area three times daily as needed.    ENTRESTO 49-51 mg tablet TAKE 1 TABLET BY MOUTH TWICE A DAY    levothyroxine (SYNTHROID) 175 mcg tablet Take one tablet by mouth daily.    liraglutide (VICTOZA 2-PAK) 0.6 mg/0.1 mL (18 mg/3 mL) injection pen Inject 1.2 mg under the skin daily.    meloxicam (MOBIC) 15 mg tablet Take one tablet by mouth daily.    metoprolol succinate XL (TOPROL XL) 25 mg extended release tablet TAKE 1 TABLET BY MOUTH DAILY    multivit-min/folic/vit K/lycop (ONE-A-DAY MEN'S MULTIVITAMIN PO) Take 1 tablet by mouth daily.    naproxen (NAPROSYN) 500 mg tablet Take one tablet by mouth as Needed.    primidone (MYSOLINE) 50 mg tablet Take three tablets by mouth daily.    spironolactone (ALDACTONE) 25 mg tablet TAKE ONE TABLET BY MOUTH DAILY. TAKE WITH FOOD.

## 2022-09-25 ENCOUNTER — Encounter: Admit: 2022-09-25 | Discharge: 2022-09-25 | Payer: No Typology Code available for payment source

## 2022-09-25 MED ORDER — METOPROLOL SUCCINATE 25 MG PO TB24
25 mg | ORAL_TABLET | Freq: Every day | ORAL | 3 refills | 90.00000 days | Status: AC
Start: 2022-09-25 — End: ?

## 2022-10-27 ENCOUNTER — Encounter: Admit: 2022-10-27 | Discharge: 2022-10-27 | Payer: No Typology Code available for payment source

## 2022-10-27 NOTE — Telephone Encounter
Patient called nursing line stating that over the past few days he has been experiencing extreme fatigue, headaches, and chest pressure. He states that his heart rate has been running in the 30s-40s. He states that he overall just does not feel well. Patient states that he is currently in the car driving to the hospital. Urged patient to report to the ER as soon as possible for evaluation. Patient verbalized understanding and has no further questions at this time. Moved patient's f/u appointment to 11/12/22 with [facility] for further evaluation.

## 2022-11-05 ENCOUNTER — Encounter: Admit: 2022-11-05 | Discharge: 2022-11-05 | Payer: No Typology Code available for payment source

## 2022-11-12 ENCOUNTER — Encounter: Admit: 2022-11-12 | Discharge: 2022-11-12 | Payer: No Typology Code available for payment source

## 2022-11-12 DIAGNOSIS — E785 Hyperlipidemia, unspecified: Secondary | ICD-10-CM

## 2022-11-12 DIAGNOSIS — Z9861 Coronary angioplasty status: Secondary | ICD-10-CM

## 2022-11-12 DIAGNOSIS — E8881 Metabolic syndrome: Secondary | ICD-10-CM

## 2022-11-12 DIAGNOSIS — G4733 Obstructive sleep apnea (adult) (pediatric): Secondary | ICD-10-CM

## 2022-11-12 DIAGNOSIS — R002 Palpitations: Secondary | ICD-10-CM

## 2022-11-12 DIAGNOSIS — R7309 Other abnormal glucose: Secondary | ICD-10-CM

## 2022-11-12 DIAGNOSIS — R5381 Other malaise: Secondary | ICD-10-CM

## 2022-11-12 DIAGNOSIS — T148XXA Other injury of unspecified body region, initial encounter: Secondary | ICD-10-CM

## 2022-11-12 DIAGNOSIS — R079 Chest pain, unspecified: Secondary | ICD-10-CM

## 2022-11-12 DIAGNOSIS — I4729 NSVT (nonsustained ventricular tachycardia) (HCC): Secondary | ICD-10-CM

## 2022-11-12 DIAGNOSIS — I255 Ischemic cardiomyopathy: Secondary | ICD-10-CM

## 2022-11-12 DIAGNOSIS — I493 Ventricular premature depolarization: Secondary | ICD-10-CM

## 2022-11-12 DIAGNOSIS — T8859XA Other complications of anesthesia, initial encounter: Secondary | ICD-10-CM

## 2022-11-12 NOTE — Progress Notes
Date of Service: 11/12/2022    Connor Wells is a 67 y.o. male.       HPI     A 67 year old patient with a history of metabolic syndrome with morbid obesity, obstructive sleep apnea, chronic PVCs, and previous non-sustained VT, prediabetes, presents with recurrent chest pain and decreased energy levels. The patient has a history of ischemic cardiomyopathy with left ventricle ejection fraction at 40% before PCI to the LED in 2022. The patient has lost approximately 50 pounds since the last visit and has been focusing on weight control while taking Victoza.  He has been unable to tolerate CPAP therapy for his obstructive sleep apnea in the past.. The patient reports feeling constantly tired and has noticed a decrease in energy levels. The patient also mentions feeling something in their chest a lot and has been experiencing PVCs that come and go.  Recently went into the emergency room because of general malaise and fatigue as well as increased sensation for the premature ventricular contractions.  Evaluation including chest CT, lab work, ECG did not reveal acute abnormality.  Reports overall seems like his heart rate and the frequency of the PVCs have improved and he is felt better since then as well.  Reports he has been compliant tolerant with his medication.         Vitals:    11/12/22 0923   BP: 104/68   BP Source: Arm, Left Upper   Pulse: 68   SpO2: 99%   O2 Device: None (Room air)   PainSc: Two   Weight: (!) 144.9 kg (319 lb 6.4 oz)   Height: 182.9 cm (6')     Body mass index is 43.32 kg/m?Marland Kitchen     Past Medical History  Patient Active Problem List    Diagnosis Date Noted    Coronary artery disease with angina pectoris, unspecified vessel or lesion type, unspecified whether native or transplanted heart (HCC) 08/12/2021    Abnormal stress test 04/21/2021    Cardiomyopathy, unspecified (HCC) 04/21/2021    Coronary artery disease due to lipid rich plaque 04/21/2021     LHC 04/21/21: PCI LAD       Chronic fatigue syndrome 04/15/2021    Hypothyroidism 04/15/2021    Obesity 04/15/2021    Chest pain 04/15/2021     04/09/21 Regadenoson MPI:  Abnormal.   Moderate size, severe intensity reversible perfusion defect involving the basal anterior, mid anterior and mid anterolateral segments with extension into the the apical lateral portions of the left ventricle.  There is corresponding hypokinesis  Global LV systolic function is moderately impaired.  LVEF 36%.  LV is mildly dilated at rest without any definite transient ischemic dilatation.  Intermediate risk.      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   HENT: Negative.     Eyes: Negative.    Cardiovascular:  Positive for chest pain, dyspnea on exertion, irregular heartbeat, leg swelling and palpitations.   Respiratory:  Positive for shortness of breath.    Endocrine: Negative.    Hematologic/Lymphatic: Negative.    Skin: Negative.    Musculoskeletal:  Positive for back pain and neck pain.   Gastrointestinal: Negative.  Genitourinary: Negative.    Neurological:  Positive for dizziness, headaches, light-headedness, tremors and weakness.   Psychiatric/Behavioral: Negative.     Allergic/Immunologic: Negative.        Physical Exam  Awake and alert, pleasant gentleman in no distress.  Morbidly obese  Pupils are equal reactive without scleral injection  Neck with large circumference and difficult to discern landmarks but no jugular venous distention, bruits, or masses  Chest is symmetric and lungs are clear to auscultation  Heart S1, S2 that are normal.  No send murmur, click, or gallop  Abdomen is obese but soft  Pulse are 2+, regular, and symmetric bilaterally radial as well as pedal location  No peripheral edema with symmetric muscle tone and skin turgor    Cardiovascular Studies      Cardiovascular Health Factors  Vitals BP Readings from Last 3 Encounters:   11/12/22 104/68   07/16/22 112/68   02/03/22 104/60     Wt Readings from Last 3 Encounters:   11/12/22 (!) 144.9 kg (319 lb 6.4 oz)   07/16/22 (!) 141.3 kg (311 lb 6.4 oz)   02/03/22 (!) 150.7 kg (332 lb 3.2 oz)     BMI Readings from Last 3 Encounters:   11/12/22 43.32 kg/m?   07/16/22 42.23 kg/m?   02/03/22 45.05 kg/m?      Smoking Social History     Tobacco Use   Smoking Status Never   Smokeless Tobacco Never      Lipid Profile Cholesterol   Date Value Ref Range Status   06/27/2021 114  Final     HDL   Date Value Ref Range Status   06/27/2021 40  Final     LDL   Date Value Ref Range Status   06/27/2021 61  Final     Triglycerides   Date Value Ref Range Status   06/27/2021 69  Final      Blood Sugar Hemoglobin A1C   Date Value Ref Range Status   08/22/2021 5.4  Final     Glucose   Date Value Ref Range Status   10/27/2022 91  Final   12/11/2021 101  Final   06/27/2021 109 (H) 70 - 105 Final          Problems Addressed Today  Encounter Diagnoses   Name Primary?    Palpitations Yes    Metabolic syndrome     Malaise and fatigue     OSA (obstructive sleep apnea)     PVC (premature ventricular contraction)     Ischemic cardiomyopathy     NSVT (nonsustained ventricular tachycardia) (HCC)     History of percutaneous coronary intervention     Class 3 severe obesity with serious comorbidity and body mass index (BMI) of 40.0 to 44.9 in adult, unspecified obesity type (HCC)     Elevated glucose        Assessment and Plan     Premature Ventricular Contractions (PVCs): Frequent PVCs with occasional bigeminal patterns and one episode of non-sustained VT. Symptoms of fatigue and chest discomfort, possibly related to PVCs.  -Order complete blood work including thyroid function, lipid profile, and hemoglobin A1c.  -Plan for chemical stress test to evaluate cardiac perfusion.  -Will arrange for him to see electrophysiology to further evaluate and manage his premature ventricular contractions.    Ischemic Cardiomyopathy: Improved ejection fraction (45-60%) on imaging post PCI in September 2022.  Based upon recent symptoms we will plan for repeat nuclear cardiac stress test.  -Continue current  management and monitor symptoms.    Weight Management: Patient has lost significant weight (approximately 50 pounds) but has plateaued recently. Currently on Victoza for weight control.  -Encourage continued weight management efforts.  -Consider strategies to overcome weight loss plateau.    Sleep Apnea: History of difficulty with CPAP use.  -No specific plan discussed in the conversation.    General Health Maintenance:  -Check cholesterol levels as patient is due for a repeat test.         Current Medications (including today's revisions)   aspirin EC (ASPIR-LOW) 81 mg tablet Take one tablet by mouth daily. Take with food.    atorvastatin (LIPITOR) 80 mg tablet TAKE 1 TABLET BY MOUTH DAILY    ENTRESTO 49-51 mg tablet TAKE 1 TABLET BY MOUTH TWICE A DAY    levothyroxine (SYNTHROID) 175 mcg tablet Take one tablet by mouth daily.    liraglutide (VICTOZA 2-PAK) 18 mg/3 mL injection pen Inject 1.8 mg under the skin daily. Change in dose.  Fill when requested.  Indications: type 2 diabetes mellitus    meloxicam (MOBIC) 15 mg tablet Take one tablet by mouth daily.    metoprolol succinate XL (TOPROL XL) 25 mg extended release tablet TAKE 1 TABLET BY MOUTH EVERY DAY    multivit-min/folic/vit K/lycop (ONE-A-DAY MEN'S MULTIVITAMIN PO) Take 1 tablet by mouth daily.    naproxen (NAPROSYN) 500 mg tablet Take one tablet by mouth as Needed.    primidone (MYSOLINE) 50 mg tablet Take three tablets by mouth daily.    spironolactone (ALDACTONE) 25 mg tablet TAKE ONE TABLET BY MOUTH DAILY. TAKE WITH FOOD.

## 2022-11-13 ENCOUNTER — Encounter: Admit: 2022-11-13 | Discharge: 2022-11-13 | Payer: No Typology Code available for payment source

## 2022-11-13 MED ORDER — SPIRONOLACTONE 25 MG PO TAB
25 mg | ORAL_TABLET | Freq: Every day | ORAL | 3 refills | 90.00000 days | Status: AC
Start: 2022-11-13 — End: ?

## 2022-11-19 ENCOUNTER — Encounter: Admit: 2022-11-19 | Discharge: 2022-11-19 | Payer: No Typology Code available for payment source

## 2022-11-19 DIAGNOSIS — R7309 Other abnormal glucose: Secondary | ICD-10-CM

## 2022-11-19 DIAGNOSIS — R002 Palpitations: Secondary | ICD-10-CM

## 2022-11-19 DIAGNOSIS — I493 Ventricular premature depolarization: Secondary | ICD-10-CM

## 2022-11-19 DIAGNOSIS — Z9861 Coronary angioplasty status: Secondary | ICD-10-CM

## 2022-11-19 DIAGNOSIS — I4729 NSVT (nonsustained ventricular tachycardia) (HCC): Secondary | ICD-10-CM

## 2022-11-19 DIAGNOSIS — G4733 Obstructive sleep apnea (adult) (pediatric): Secondary | ICD-10-CM

## 2022-11-19 DIAGNOSIS — R5381 Other malaise: Secondary | ICD-10-CM

## 2022-11-19 DIAGNOSIS — E8881 Metabolic syndrome: Secondary | ICD-10-CM

## 2022-11-19 DIAGNOSIS — I255 Ischemic cardiomyopathy: Secondary | ICD-10-CM

## 2022-11-19 LAB — LIPID PROFILE
CHOLESTEROL/HDL %: 2
CHOLESTEROL: 100
HDL: 44
LDL: 45
TRIGLYCERIDES: 55
VLDL: 11

## 2022-11-19 LAB — HEMOGLOBIN A1C: HEMOGLOBIN A1C: 5 — AB

## 2022-11-19 LAB — TSH WITH FREE T4 REFLEX
FREE T4: 1.2
THYROID SCREEN TSH: 0 — ABNORMAL LOW (ref 0.35–4.94)

## 2022-11-19 LAB — THYROID STIMULATING HORMONE-TSH: TSH: 0 — ABNORMAL LOW (ref 0.35–4.94)

## 2022-11-20 ENCOUNTER — Encounter: Admit: 2022-11-20 | Discharge: 2022-11-20 | Payer: No Typology Code available for payment source

## 2022-11-20 NOTE — Telephone Encounter
Discussed lab results with patient. Answered all questions and patient does not have any further concerns or questions at this time.

## 2022-11-20 NOTE — Telephone Encounter
-----   Message from Levora Angel, MD sent at 11/19/2022  4:17 PM CDT -----  Let him know that his thyroid function is stable.   ----- Message -----  From: Lauralee Evener, RN  Sent: 11/19/2022  12:39 PM CDT  To: Levora Angel, MD    Follow up labs from recent office visit for your review

## 2022-11-26 ENCOUNTER — Ambulatory Visit: Admit: 2022-11-26 | Discharge: 2022-11-26 | Payer: No Typology Code available for payment source

## 2022-11-26 ENCOUNTER — Encounter: Admit: 2022-11-26 | Discharge: 2022-11-26 | Payer: No Typology Code available for payment source

## 2022-11-26 DIAGNOSIS — I255 Ischemic cardiomyopathy: Secondary | ICD-10-CM

## 2022-11-26 DIAGNOSIS — R5381 Other malaise: Secondary | ICD-10-CM

## 2022-11-26 DIAGNOSIS — I493 Ventricular premature depolarization: Secondary | ICD-10-CM

## 2022-11-26 DIAGNOSIS — R002 Palpitations: Secondary | ICD-10-CM

## 2022-11-26 DIAGNOSIS — R7309 Other abnormal glucose: Secondary | ICD-10-CM

## 2022-11-26 DIAGNOSIS — G4733 Obstructive sleep apnea (adult) (pediatric): Secondary | ICD-10-CM

## 2022-11-26 DIAGNOSIS — E8881 Metabolic syndrome: Secondary | ICD-10-CM

## 2022-11-26 DIAGNOSIS — Z9861 Coronary angioplasty status: Secondary | ICD-10-CM

## 2022-11-26 DIAGNOSIS — I4729 NSVT (nonsustained ventricular tachycardia) (HCC): Secondary | ICD-10-CM

## 2022-12-01 ENCOUNTER — Encounter: Admit: 2022-12-01 | Discharge: 2022-12-01 | Payer: No Typology Code available for payment source

## 2022-12-01 NOTE — Telephone Encounter
Called and discussed results with patient.  No questions at this time.  Pt will callback with any questions, concerns or problems.

## 2022-12-01 NOTE — Telephone Encounter
-----   Message from Agapito Games, RN sent at 12/01/2022  9:34 AM CDT -----    ----- Message -----  From: Levora Angel, MD  Sent: 12/01/2022   9:30 AM CDT  To: [facility] Nurse [facility] Team [facility]    His stress test shows no acute changes for ischemia okay for blood supply to his heart.  His heart function otherwise appears stable.  Does have a lot of the premature ventricular beats during the study.  Proceed to electrophysiology evaluation as soon as able as previously  recommended  ----- Message -----  From: Vena Austria, MD  Sent: 11/27/2022  11:47 AM CDT  To: Levora Angel, MD

## 2022-12-25 ENCOUNTER — Encounter: Admit: 2022-12-25 | Discharge: 2022-12-25 | Payer: No Typology Code available for payment source

## 2022-12-25 MED ORDER — ENTRESTO 49-51 MG PO TAB
ORAL_TABLET | 11 refills | Status: AC
Start: 2022-12-25 — End: ?

## 2023-01-31 ENCOUNTER — Encounter: Admit: 2023-01-31 | Discharge: 2023-01-31 | Payer: No Typology Code available for payment source

## 2023-01-31 MED ORDER — ATORVASTATIN 80 MG PO TAB
80 mg | ORAL_TABLET | Freq: Every day | ORAL | 3 refills
Start: 2023-01-31 — End: ?

## 2023-04-02 ENCOUNTER — Encounter: Admit: 2023-04-02 | Discharge: 2023-04-02 | Payer: No Typology Code available for payment source

## 2023-04-05 ENCOUNTER — Encounter: Admit: 2023-04-05 | Discharge: 2023-04-05 | Payer: No Typology Code available for payment source

## 2023-04-07 ENCOUNTER — Encounter: Admit: 2023-04-07 | Discharge: 2023-04-07 | Payer: No Typology Code available for payment source

## 2023-04-07 ENCOUNTER — Ambulatory Visit: Admit: 2023-04-07 | Discharge: 2023-04-08 | Payer: No Typology Code available for payment source

## 2023-04-07 DIAGNOSIS — R0989 Other specified symptoms and signs involving the circulatory and respiratory systems: Secondary | ICD-10-CM

## 2023-04-07 DIAGNOSIS — E785 Hyperlipidemia, unspecified: Secondary | ICD-10-CM

## 2023-04-07 DIAGNOSIS — T8859XA Other complications of anesthesia, initial encounter: Secondary | ICD-10-CM

## 2023-04-07 DIAGNOSIS — T148XXA Other injury of unspecified body region, initial encounter: Secondary | ICD-10-CM

## 2023-04-07 DIAGNOSIS — R079 Chest pain, unspecified: Secondary | ICD-10-CM

## 2023-04-07 DIAGNOSIS — I428 Other cardiomyopathies: Secondary | ICD-10-CM

## 2023-04-07 DIAGNOSIS — I25119 Atherosclerotic heart disease of native coronary artery with unspecified angina pectoris: Secondary | ICD-10-CM

## 2023-04-07 NOTE — Progress Notes
Date of Service: 04/07/2023    Connor Wells  is a 67 y.o. male     Referred by:     HPI    Subjective         [name], a 67 year old individual with a history of metabolic syndrome, morbid obesity, sleep apnea, prediabetes, and ischemic cardiomyopathy, presented for a cardiac electrophysiology follow-up visit. The patient had a percutaneous coronary intervention (PCI) to the left anterior descending artery (LAD) in 2022, following which there was a significant improvement in his ejection fraction (EF) from 40% to 45-60%. He also reported a weight loss of 50 pounds since the procedure. However, the patient has been experiencing frequent premature ventricular contractions (PVCs), occasional [place]y, and one episode of [place] VT, leading to the current consultation.    The patient reported experiencing heart rate irregularities, describing it as going nuts at times. He noted that these symptoms started after receiving the COVID-19 vaccination and subsequently having a stent placed due to heart problems. Prior to these interventions, the patient was asymptomatic. The patient described experiencing chest pain, shortness of breath, and exercise intolerance, which led to a stress test and subsequent cardiac catheterization in September 2022. A stent was placed in one of the vessels during this procedure.    The patient also reported experiencing palpitations or skipped beats, which he described as an extra beat. These symptoms were intermittent and not consistent. The patient also reported feeling unwell and lightheaded, with episodes of feeling like he was about to pass out. He also reported significant fatigue and lack of energy.    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 He also reported recent swelling in his legs, particularly the right leg, which started within the last week. The patient has a history of knee replacement surgery 15 years ago and recently had fluid drained from one of his knees due to swelling.    The patient has been managing his conditions with a regimen of medications, including baby aspirin, atorvastatin, Entresto, gabapentin, thyroid medication, Victoza, meloxicam, metoprolol, Naprosyn as needed, and a water pill. He reported a significant loss of appetite since starting Victoza, which he has been on for approximately two years. The patient also reported a slow metabolism, which he believes contributes to his fatigue and difficulty losing weight.           Assessment and Plan    Problems Addressed Today  Encounter Diagnoses   Name Primary?    Cardiovascular symptoms Yes    Other cardiomyopathies (HCC)     Coronary artery disease with angina pectoris, unspecified vessel or lesion type, unspecified whether native or transplanted heart (HCC)             LABS  TSH: Low (03/2023)  Free T4: Normal (03/2023)    DIAGNOSTIC  Cardiac catheterization: 80-90% blockage in proximal LAD, 90% stenosis in diagonal vessel, 70-80% stenosis in superior branch, 40% stenosis in inferior branch, 20-30% narrowing in RCA (04/21/2021)  Stress test: Moderate-sized severe intensity reversible defect in basal mid anterior wall (04/14/2021)  Stress test: No new areas of ischemia, EF 52% (11/2022)  Zio patch: 6.6% PVC burden, short runs of non-sustained VT, short runs of supraventricular tachycardia (06/2021)  Zio patch: 1.9% PVC burden (09/2021)  Sleep study: Severe obstructive sleep apnea, 311 minutes with oxygen level <88% (11/2021)               Ischemic  Cardiomyopathy  EF improved from 45% to 52% after PCI to LAD in 2022. No new areas of ischemia on recent stress test.  -Continue current medications including Aspirin, Atorvastatin 80mg  daily, and Entresto BID.  -Continue monitoring symptoms and follow up with new cardiologist in October.    Premature Ventricular Contractions (PVCs)  Burden decreased from 6.6% to 1.9%. No current symptoms of lightheadedness or syncope.  -Continue Metoprolol.  -No current indication for ablation or additional medications.  -Treating sleep apnea would be critical if he wants to have repeat sleep study and healthy heart.    Sleep Apnea  Severe obstructive sleep apnea diagnosed in the past. Patient says he had bad cold at that time and does not trust the results. he is also very much against wearing a mask. Patient reports chronic fatigue.  -Recommend repeat sleep study.  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 Recent fluid accumulation and pain in knees and ankles. Pitting edema  -Continue Meloxicam daily for arthritis.  -Avoid concurrent use of Naprosyn and Meloxicam due to risk of stomach irritation.    General Health Maintenance  -Continue monitoring weight daily to assess for fluid accumulation.  -Continue current medications including Primidone for familial tremors, Gabapentin for nerve pain, and Liraglutide (Victoza) for prediabetes.  -Check thyroid function regularly due to current thyroid medication.           All results for 12-Lead ECG this visit   ECG 12-LEAD    Collection Time: 04/07/23  9:18 AM   Result Value Status    VENTRICULAR RATE 66 Incomplete    P-R INTERVAL 192 Incomplete    QRS DURATION 122 Incomplete    Q-T INTERVAL 414 Incomplete    QTC CALCULATION (BAZETT) 434 Incomplete    P AXIS 67 Incomplete    R AXIS -11 Incomplete    T AXIS 29 Incomplete    Impression    Normal sinus rhythm  Right bundle branch block  Abnormal ECG  When compared with ECG of 22-Apr-2021 04:14,  premature ventricular complexes are no longer present  Criteria for Inferior infarct are no longer present        Thank you for letting us participate in the care of your patient. Please feel free to contact us if you have any questions or concerns.           Vitals:    04/07/23 0906   BP: 103/74   BP Source: Arm, Left Upper   Pulse: 68 SpO2: 95%   O2 Device: None (Room air)   PainSc: Six   Weight: (!) 148 kg (326 lb 3.2 oz)   Height: 182.9 cm (6')      Body mass index is 44.24 kg/m?Marland Kitchen     Past Medical History         Chest pain  Complication of anesthesia      Comment:  difficulty waking up; patient states combative upon                arousal after anesthesia  Fracture      Comment:  Rt wrist; Lt elbow  Hyperlipemia     Review of Systems   Constitutional: Positive for malaise/fatigue and weight gain.   HENT: Negative.     Eyes: Negative.    Cardiovascular:  Positive for chest pain, dyspnea on exertion and leg swelling.   Respiratory:  Positive for shortness of breath.  Endocrine: Negative.    Hematologic/Lymphatic: Negative.    Skin: Negative.    Musculoskeletal:  Positive for back pain, joint pain, muscle cramps and myalgias.   Gastrointestinal: Negative.    Genitourinary: Negative.    Neurological:  Positive for headaches.   Psychiatric/Behavioral: Negative.     Allergic/Immunologic: Negative.         Physical Exam     Patient is a moderately well-built gentleman who is comfortable at rest,  not in any distress.  Sclerae anicteric.  The oral mucosa is moist and pink.  Neck is  supple without any lymphadenopathy.  Lungs are clear to auscultation bilaterally.  Breath  sounds are normal.  Cardiac exam reveals normal S1, S2 with regular rate and  rhythm.  No murmurs, rubs or gallops noted.  Abdomen:  Soft, nontender, nondistended.  Bowel sounds are present.  Extremities:  No cyanosis, clubbing but 1+ edema.  Peripheral  pulses are symmetric.  Skin without any rash. . No gross motor or neuro deficits.    Cardiovascular Studies           Cardiovascular Health Factors  Vitals BP Readings from Last 3 Encounters:   04/07/23 103/74   11/12/22 104/68   07/16/22 112/68     Wt Readings from Last 3 Encounters:   04/07/23 (!) 148 kg (326 lb 3.2 oz)   11/12/22 (!) 144.9 kg (319 lb 6.4 oz)   07/16/22 (!) 141.3 kg (311 lb 6.4 oz)     BMI Readings from Last 3 Encounters:   04/07/23 44.24 kg/m?   11/12/22 43.32 kg/m?   07/16/22 42.23 kg/m?      Smoking Social History     Tobacco Use   Smoking Status Never   Smokeless Tobacco Never      Lipid Profile Cholesterol   Date Value Ref Range Status   11/19/2022 100  Final     HDL   Date Value Ref Range Status   11/19/2022 44  Final     LDL   Date Value Ref Range Status   11/19/2022 45  Final     Triglycerides   Date Value Ref Range Status   11/19/2022 55  Final      Blood Sugar Hemoglobin A1C   Date Value Ref Range Status   11/19/2022 5.0  Final     Glucose   Date Value Ref Range Status   03/21/2023 98  Final   10/27/2022 91  Final   12/11/2021 101  Final          Current Medications (including today's revisions)   aspirin EC (ASPIR-LOW) 81 mg tablet Take one tablet by mouth daily. Take with food.    atorvastatin (LIPITOR) 80 mg tablet TAKE 1 TABLET BY MOUTH EVERY DAY    ENTRESTO 49-51 mg tablet TAKE 1 TABLET BY MOUTH TWICE A DAY    gabapentin (NEURONTIN) 300 mg capsule Take two capsules by mouth at bedtime daily.    levothyroxine (SYNTHROID) 175 mcg tablet Take one tablet by mouth daily.    liraglutide (VICTOZA 2-PAK) 18 mg/3 mL injection pen Inject 1.8 mg under the skin daily. Change in dose.  Fill when requested.  Indications: type 2 diabetes mellitus    meloxicam (MOBIC) 15 mg tablet Take one tablet by mouth daily.    metoprolol succinate XL (TOPROL XL) 25 mg extended release tablet TAKE 1 TABLET BY MOUTH EVERY DAY    multivit-min/folic/vit K/lycop (ONE-A-DAY MEN'S MULTIVITAMIN PO) Take 1 tablet by mouth daily.  naproxen (NAPROSYN) 500 mg tablet Take one tablet by mouth as Needed.    primidone (MYSOLINE) 50 mg tablet Take three tablets by mouth daily.    spironolactone (ALDACTONE) 25 mg tablet TAKE ONE TABLET BY MOUTH DAILY. TAKE WITH FOOD.

## 2023-05-26 ENCOUNTER — Encounter: Admit: 2023-05-26 | Discharge: 2023-05-26 | Payer: MEDICARE

## 2023-05-26 MED ORDER — LIRAGLUTIDE (18 MG/3 ML) SC INJ PEN
1.8 mg | Freq: Every day | SUBCUTANEOUS | 11 refills | Status: AC
Start: 2023-05-26 — End: ?

## 2023-05-26 NOTE — Telephone Encounter
Connor Wells called into the nursing line and reports that he has been having some breast/nipple pain.  He states he went to his PCP and she told him that he should call us because it could be related to the spironolactone.  He was hoping we could provide an alternative medication for him.    Will route to Dr. Harvel Ricks for review and recommendations.

## 2023-05-28 ENCOUNTER — Encounter: Admit: 2023-05-28 | Discharge: 2023-05-28 | Payer: MEDICARE

## 2023-05-28 NOTE — Progress Notes
Pharmacy Benefits Investigation    Medication name: Elnita Maxwell [facility]    The insurance requires a prior authorization for the medication. The prior authorization was submitted via CoverMyMeds. Will follow up in 2 business days.    PA number: Marc Morgans  Specialty Pharmacy Patient Advocate

## 2023-05-29 ENCOUNTER — Encounter: Admit: 2023-05-29 | Discharge: 2023-05-29 | Payer: MEDICARE

## 2023-06-01 ENCOUNTER — Encounter: Admit: 2023-06-01 | Discharge: 2023-06-01 | Payer: MEDICARE

## 2023-06-01 NOTE — Progress Notes
Pharmacy Benefits Investigation    Medication name: Elnita Maxwell [facility]    The prior authorization was denied by insurance. The insurance did not provide an alternative that is covered.    If an alternative therapy is not appropriate and the prescribed medication and dose are necessary, the insurance will require the decision be appealed. This will require a letter for medical necessity. The clinic was notified. To proceed with an appeal, completed appeal materials should be returned to the specialty pharmacy team.    Alvino Chapel  Specialty Pharmacy Patient Advocate

## 2023-06-09 ENCOUNTER — Encounter: Admit: 2023-06-09 | Discharge: 2023-06-09 | Payer: MEDICARE

## 2023-09-07 ENCOUNTER — Ambulatory Visit: Admit: 2023-09-07 | Discharge: 2023-09-07 | Payer: MEDICARE

## 2023-09-07 ENCOUNTER — Encounter: Admit: 2023-09-07 | Discharge: 2023-09-07 | Payer: MEDICARE

## 2023-09-07 DIAGNOSIS — R5381 Other malaise: Secondary | ICD-10-CM

## 2023-09-07 DIAGNOSIS — I493 Ventricular premature depolarization: Secondary | ICD-10-CM

## 2023-09-07 DIAGNOSIS — E8881 Metabolic syndrome: Secondary | ICD-10-CM

## 2023-09-07 DIAGNOSIS — I4729 NSVT (nonsustained ventricular tachycardia) (HCC): Secondary | ICD-10-CM

## 2023-09-07 DIAGNOSIS — E66813 Class 3 severe obesity with serious comorbidity and body mass index (BMI) of 40.0 to 44.9 in adult, unspecified obesity type (HCC): Secondary | ICD-10-CM

## 2023-09-07 DIAGNOSIS — R7309 Other abnormal glucose: Secondary | ICD-10-CM

## 2023-09-07 DIAGNOSIS — I255 Ischemic cardiomyopathy: Secondary | ICD-10-CM

## 2023-09-07 DIAGNOSIS — G4733 Obstructive sleep apnea (adult) (pediatric): Secondary | ICD-10-CM

## 2023-09-07 DIAGNOSIS — R002 Palpitations: Secondary | ICD-10-CM

## 2023-09-07 DIAGNOSIS — Z9861 Coronary angioplasty status: Secondary | ICD-10-CM

## 2023-09-07 NOTE — Progress Notes
Cardiovascular Medicine       Date of Service: 09/07/2023      HPI     Jarrah Babich is a 68 y.o. male who was seen today in the Cardiovascular Medicine Clinic at [facility] at our [place] office.  He is a former patient of Dr. Harvel Ricks.  He also previously saw Dr. Wallene Huh in clinic for evaluation of premature ventricular contractions.    He has a past medical history of coronary artery disease (PCI to LAD in 2022), history of ischemic cardiomyopathy with improved ejection fraction, hypertension, hyperlipidemia, morbid obesity, sleep apnea and premature ventricular contractions.  He presents today for follow-up.  He reports no significant symptoms at this time.  He had some chest pain last year which prompted a stress test that revealed no significant inducible ischemia.  He also had premature ventricular contractions and was started on metoprolol with significant decrease in burden.  He reports he was diagnosed with sleep apnea but does not want to pursue any therapies including a CPAP.         Cardiac catheterization: 80-90% blockage in proximal LAD, 90% stenosis in diagonal vessel, 70-80% stenosis in superior branch, 40% stenosis in inferior branch, 20-30% narrowing in RCA (04/21/2021)  Stress test: Moderate-sized severe intensity reversible defect in basal mid anterior wall (04/14/2021)  Stress test: No new areas of ischemia, EF 52% (11/2022)  Zio patch: 6.6% PVC burden, short runs of non-sustained VT, short runs of supraventricular tachycardia (06/2021)  Zio patch: 1.9% PVC burden (09/2021)  Sleep study: Severe obstructive sleep apnea, 311 minutes with oxygen level <88% (11/2021)         Assessment & Plan   68 y.o. male patient with the following medical problems:    Coronary artery disease s/p PCI to LAD.  Hypertension.  Hyperlipidemia.  Morbid obesity.  Premature ventricular contractions.  History of ischemic cardiomyopathy with improved ejection fraction.    Doing well, NYHA class I.  Works for a [employer] and is able to perform his job with no limiting symptoms.  Continue Entresto, metoprolol, aspirin and atorvastatin.  Will repeat echocardiogram to reassess EF.  Victoza no longer covered by insurance.  Will prescribe Wegovy given morbid obesity, history of coronary artery disease and elevated ASCVD risk.  He remains hesitant to consider testing or treating sleep apnea.    Return to clinic in 6 months.         Past Medical History  Patient Active Problem List    Diagnosis Date Noted    PVC (premature ventricular contraction) 04/02/2023     Per OV note on 11/12/2022 with Dr. Carollee Herter Hoos-Thompson    09/09/2021 - Zio Patch:  Sinus rhythm.  Rare premature atrial beats with short episodes of grouped PACs.  Rare PVCs with intermittent aberrant conduction.  Identified PVCs in bigeminal pattern.  No significant pauses or sustained arrhythmias.  No symptoms correlated to times of ectopic beats.        Coronary artery disease with angina pectoris, unspecified vessel or lesion type, unspecified whether native or transplanted heart (HCC) 08/12/2021    Abnormal stress test 04/21/2021    Cardiomyopathy, unspecified (HCC) 04/21/2021     06/17/2021 - ECHO:  The left ventricular systolic function is normal. The visually estimated ejection fraction is 60%.  Grade I (mild) left ventricular diastolic dysfunction. Normal left atrial pressure.  The right ventricle is mildly dilated with preserved systolic function.  No significant valvular abnormalities.  Pulmonary artery pressure  could not be calculated by this study due to incomplete TR signal.      Coronary artery disease due to lipid rich plaque 04/21/2021     LHC 04/21/21: PCI LAD     06/11/2021 - Regadenoson MPI:    This study is technically problematic and equivocal due to body habitus and class III obesity.  There is very prominent bowel activity artifact.  Overall the study suggests no statistically significant ischemia according to quantitative polar maps but qualitatively there is extensive and diffuse reversible uptake throughout the left ventricular myocardium except for the septum.  Global left ventricular ejection fraction is mildly depressed with a calculated left ventricular ejection fraction of 45%.  Because of the limitations and and the equivocal nature of SPECT imaging in this patient with a BMI of nearly 50, recommend nitrogen 13 ammonia regadenoson stress PET/CT to eliminate confounding attenuation artifacts and to quantitate regional myocardial blood flow under pharmacological stress.  This exam is compared to a previous myocardial perfusion study using technetium sestamibi performed on 04/09/2021.  The previous study also was performed with regadenoson with right bundle branch block and sinus rhythm and was negative for EKG evidence of ischemia.  The previous study had a left ventricular ejection fraction of 36% with a summed stress score of 12 and a rest score of 0.  The previous study left ventricular end-diastolic volume was 181 mL.  The study was interpreted to show moderate-severe reversible basilar anterior and mid anterolateral ischemia extending to the apex.  Qualitatively the studies appear to be similar on the static image datasets but the summed stress score is dramatically less on the current exam.  11/26/2022 - Regadenoson MPI:  This study is normal and represents low probability for significant inducible myocardial ischemia. There are no perfusion abnormalities identified on this study. Regional and global left ventricular function are within normal limits. High risk scintigraphic features are absent.   This study was compared to the previous one performed on 06/10/2021. The previous perfusion pattern was technically difficult with numerous soft tissue attenuation artifacts. The previous LVEF was 45 % with an LVEDV of 147 mL.  In aggregate the current study is low risk in regards to predicted annual cardiovascular mortality rate.      Chronic fatigue syndrome 04/15/2021    Hypothyroidism 04/15/2021    Obesity 04/15/2021    Chest pain 04/15/2021     04/09/21 Regadenoson MPI:  Abnormal.   Moderate size, severe intensity reversible perfusion defect involving the basal anterior, mid anterior and mid anterolateral segments with extension into the the apical lateral portions of the left ventricle.  There is corresponding hypokinesis  Global LV systolic function is moderately impaired.  LVEF 36%.  LV is mildly dilated at rest without any definite transient ischemic dilatation.  Intermediate risk.      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     Review of Systems  Review of Systems   Constitutional: Positive for weight gain.   HENT: Negative.     Eyes: Negative.    Cardiovascular:  Positive for chest pain and dyspnea on exertion.   Respiratory:  Positive for shortness of breath.    Endocrine: Negative.    Hematologic/Lymphatic: Negative.    Skin: Negative.    Musculoskeletal:  Positive for joint pain and myalgias.   Gastrointestinal: Negative.    Genitourinary: Negative.    Neurological: Negative.    Psychiatric/Behavioral: Negative.     Allergic/Immunologic: Negative.      14 point review of systems negative except as above.    Vitals:    09/07/23 1346   BP: 114/78   BP Source: Arm, Left Upper   Pulse: 70   SpO2: 96%   O2 Device: None (Room air)   PainSc: Two   Weight: (!) 153.5 kg (338 lb 6.4 oz)   Height: 182.9 cm (6')     Body mass index is 45.9 kg/m?Marland Kitchen     Physical Exam  General Appearance: no acute distress  HEENT: EOMI, mucous membranes moist, oropharynx is clear  Neck Veins: neck veins are flat & not distended  Carotid Arteries: no bruits  Chest Inspection: chest is normal in appearance  Auscultation/Percussion: lungs clear to auscultation, no rales, rhonchi, or wheezing  Cardiac Rhythm: regular rhythm & normal rate  Cardiac Auscultation: Normal S1 & S2, no S3 or S4, no rub  Murmurs: no cardiac murmurs  Abdominal Exam: soft, non-tender, normal bowel sounds, no masses or bruits  Abdominal aorta: nonpalpable   Liver & Spleen: no organomegaly  Extremities: no lower extremity edema; palpable distal pulses  Skin: warm & intact  Neurologic Exam: oriented to time, place and person; no focal neurologic deficits       Cardiovascular Studies  06/17/21   2D + DOPPLER ECHO   Result Value Ref Range    BSA 2.84 m2    Referring Provider Burnadette Pop, MD     CV ECHO PV SUPPORT STAFF [facility], [place]     LVIDD 6.0 4.2 - 5.8 cm    IVS 0.9 0.6 - 1.0 cm    PW 0.9 0.6 - 1.0 cm    LVIDS 4.6 2.5 - 4.0 cm    FS 23.33 28 - 44 %    Teichholtz 39.67 %    LA volume 66 18 - 58 mL    Sinus 3.4 2.8 - 4.0 cm    Ascending aorta 3.5 cm    LV mass 216 88 - 224 g    LA size 3.9 3.0 - 4.0 cm    RWT 0.30 <=0.42    , with a mean gradient of 4 mmHg    AV peak velocity 1.4 m/s    Aortic valve area = 2.92 cm2    AV index (native) 0.86     E/A ratio 0.67     TDI lateral e' 0.130 m/s    LVOT diameter 2.2 cm    LVOT area 3.80 cm2    LVOT peak vel 1.2 m/s    LVOT peak VTI 20.0 cm    Ao VTI 26.0 cm    LVOT stroke volume 76.03 cm3    and a peak gradient of 8 mmHg    Lateral E/E' ratio 4.62     Right Ventricular Basal Diameter 3.3 2.5 - 4.1 cm    Right Ventricular Mid Diameter 2.6 1.9 - 3.5 cm    Right Atrial Area 18.2 <18 cm2    Right Heart Systolic TDI S' 0.2 m/s    Right Heart Systolic Mmode TAPSE 1.6 >1.7 cm    MV Peak E Vel PW 0.600 m/s    MV  Peak A Vel 0.900 m/s    Left Atrium Index 23.24 16 - 34 mL/m2    Left Ventricle Mass Index 76 49 - 115 g/m2    TDI Medial e' 0.060 m/s    Medial E/E' ratio 10.00     ECHO EF 60 %        Cardiovascular Health Factors  Vitals BP Readings from Last 3 Encounters:   09/07/23 114/78   04/07/23 103/74   11/12/22 104/68     Wt Readings from Last 3 Encounters:   09/07/23 (!) 153.5 kg (338 lb 6.4 oz)   04/07/23 (!) 148 kg (326 lb 3.2 oz)   11/12/22 (!) 144.9 kg (319 lb 6.4 oz)     BMI Readings from Last 3 Encounters:   09/07/23 45.90 kg/m?   04/07/23 44.24 kg/m?   11/12/22 43.32 kg/m?      Smoking Social History     Tobacco Use   Smoking Status Never   Smokeless Tobacco Never      Lipid Profile Cholesterol   Date Value Ref Range Status   11/19/2022 100  Final     HDL   Date Value Ref Range Status   11/19/2022 44  Final     LDL   Date Value Ref Range Status   11/19/2022 45  Final     Triglycerides   Date Value Ref Range Status   11/19/2022 55  Final      Blood Sugar Hemoglobin A1C   Date Value Ref Range Status   11/19/2022 5.0  Final     Glucose   Date Value Ref Range Status   03/21/2023 98  Final   10/27/2022 91  Final   12/11/2021 101  Final        ASCVD Risk Assessment:     ASCVD 10-year risk calculated: The ASCVD Risk score (Arnett DK, et al., 2019) failed to calculate for the following reasons:    The valid total cholesterol range is 130 to 320 mg/dL     LDL 21-308, if ASCVD 10-y risk is >7.5%, high to moderate-intensity statin therapy is recommended  Diabetes with ASCVD 10-y risk >7.5%, high-intensity statin therapy is recommended.  Diabetes with ASCVD 10-y risk <7.5%, moderate-intensity statin therapy is recommended.      Current Medications (including today's revisions)   aspirin EC (ASPIR-LOW) 81 mg tablet Take one tablet by mouth daily. Take with food.    atorvastatin (LIPITOR) 80 mg tablet TAKE 1 TABLET BY MOUTH EVERY DAY    ENTRESTO 49-51 mg tablet TAKE 1 TABLET BY MOUTH TWICE A DAY    levothyroxine (SYNTHROID) 175 mcg tablet Take one tablet by mouth daily.    meloxicam (MOBIC) 15 mg tablet Take one tablet by mouth daily.    metoprolol succinate XL (TOPROL XL) 25 mg extended release tablet TAKE 1 TABLET BY MOUTH EVERY DAY    multivit-min/folic/vit K/lycop (ONE-A-DAY MEN'S MULTIVITAMIN PO) Take 1 tablet by mouth daily.    naproxen sodium (ALEVE) 220 mg tablet Take two tablets by mouth every morning. Take with food.    primidone (MYSOLINE) 50 mg tablet Take three tablets by mouth daily.         Orpah Cobb MD  Cardiovascular Medicine.

## 2023-09-10 ENCOUNTER — Encounter: Admit: 2023-09-10 | Discharge: 2023-09-10 | Payer: MEDICARE

## 2023-09-10 MED ORDER — METOPROLOL SUCCINATE 25 MG PO TB24
25 mg | ORAL_TABLET | Freq: Every day | ORAL | 1 refills | 90.00000 days | Status: AC
Start: 2023-09-10 — End: ?

## 2023-09-13 ENCOUNTER — Ambulatory Visit: Admit: 2023-09-13 | Discharge: 2023-09-14 | Payer: MEDICARE

## 2023-09-13 ENCOUNTER — Encounter: Admit: 2023-09-13 | Discharge: 2023-09-13 | Payer: MEDICARE

## 2023-09-23 ENCOUNTER — Ambulatory Visit: Admit: 2023-09-23 | Discharge: 2023-09-24 | Payer: MEDICARE

## 2023-09-27 ENCOUNTER — Encounter: Admit: 2023-09-27 | Discharge: 2023-09-27 | Payer: MEDICARE

## 2023-09-27 DIAGNOSIS — I493 Ventricular premature depolarization: Secondary | ICD-10-CM

## 2023-09-27 DIAGNOSIS — I2510 Atherosclerotic heart disease of native coronary artery without angina pectoris: Secondary | ICD-10-CM

## 2023-09-27 DIAGNOSIS — I255 Ischemic cardiomyopathy: Secondary | ICD-10-CM

## 2023-09-27 DIAGNOSIS — E669 Obesity, unspecified: Secondary | ICD-10-CM

## 2023-09-27 NOTE — Telephone Encounter
-----   Message from Janna Arch sent at 09/27/2023 10:51 AM CST -----    ----- Message -----  From: Orpah Cobb, MD  Sent: 09/27/2023  10:38 AM CST  To: Spero Geralds, PHARMD; #    Sure, we can add BMP to his labs.   Thanks  Swathi  ----- Message -----  From: Spero Geralds, PHARMD  Sent: 09/25/2023   3:49 PM CST  To: Orpah Cobb, MD    Hi Dr. Laverda Sorenson,   I met with Russ Halo and we discussed starting [facility] given his CAD. He also mentioned he started taking potassium gluconate 595 mg daily (equivalent to 2.5 mEq) about a month ago. 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 Given he is also taking Entresto, I wanted to see if you wanted to add a BMP to his future labs? He has a fasting lipid panel pending and is scheduled to follow-up with you in July.  Thanks!  Katie

## 2023-09-27 NOTE — Telephone Encounter
 Added BMP to labs as requested.

## 2023-09-28 ENCOUNTER — Encounter: Admit: 2023-09-28 | Discharge: 2023-09-28 | Payer: MEDICARE

## 2023-10-03 ENCOUNTER — Encounter: Admit: 2023-10-03 | Discharge: 2023-10-03 | Payer: MEDICARE

## 2023-10-03 NOTE — Progress Notes
 Pharmacy Benefits Investigation    Medication name: semaglutide (weight loss) (WEGOVY) 0.25 mg/0.5 mL injector PEN  Medication status: new    The insurance requires a prior authorization for the medication. The prior authorization was submitted via CoverMyMeds. Will follow up in 2 business days.    PA number: [id number]    Alvino Chapel  Specialty Pharmacy Patient Advocate

## 2023-10-15 ENCOUNTER — Encounter: Admit: 2023-10-15 | Discharge: 2023-10-15 | Payer: MEDICARE

## 2023-10-15 NOTE — Progress Notes
 Pharmacy Benefits Investigation    Medication name: semaglutide (weight loss) (WEGOVY) 0.25 mg/0.5 mL injector PEN  Medication status: new    The prior authorization was denied by insurance. The insurance did not provide an alternative that is covered.    If an alternative therapy is not appropriate and the prescribed medication and dose are necessary, the insurance will require the decision be appealed. This will require a letter for medical necessity. The clinic was notified. To proceed with an appeal, completed appeal materials should be returned to the specialty pharmacy team.    Alvino Chapel  Specialty Pharmacy Patient Advocate

## 2023-10-29 ENCOUNTER — Encounter: Admit: 2023-10-29 | Discharge: 2023-10-29 | Payer: MEDICARE

## 2023-10-29 NOTE — Progress Notes
 Pharmacy Benefits Investigation    Medication name: semaglutide (weight loss) (WEGOVY) 0.25 mg/0.5 mL injector PEN  Medication status: new    An authorization for Wegovy 0.25 mg was recently denied. The specialty pharmacy is waiting on information to guide next steps.     If an alternative therapy is not appropriate and the prescribed medication and dose are necessary, the insurance will require the decision be appealed. This will require a letter for medical necessity. To proceed with an appeal, completed appeal materials should be returned to the specialty pharmacy team.     This is the second and final attempt to contact the clinic pharmacist for next steps. At this time, the patient will no longer be followed by the specialty pharmacy team. The process can be reinitiated at any time by contacting the specialty pharmacy at [phone]. The patient was notified.    Alvino Chapel  Specialty Pharmacy Patient Financial Advocate        Alvino Chapel  Specialty Pharmacy Patient Advocate

## 2023-12-19 ENCOUNTER — Encounter: Admit: 2023-12-19 | Discharge: 2023-12-19 | Payer: MEDICARE

## 2023-12-27 ENCOUNTER — Encounter: Admit: 2023-12-27 | Discharge: 2023-12-27 | Payer: MEDICARE

## 2023-12-27 DIAGNOSIS — E66813 Class 3 severe obesity with serious comorbidity and body mass index (BMI) of 40.0 to 44.9 in adult, unspecified obesity type (CMS-HCC): Secondary | ICD-10-CM

## 2023-12-27 DIAGNOSIS — G4733 Obstructive sleep apnea (adult) (pediatric): Secondary | ICD-10-CM

## 2023-12-27 DIAGNOSIS — R5381 Other malaise: Secondary | ICD-10-CM

## 2023-12-27 DIAGNOSIS — R7309 Other abnormal glucose: Secondary | ICD-10-CM

## 2023-12-27 DIAGNOSIS — E8881 Metabolic syndrome: Secondary | ICD-10-CM

## 2023-12-27 DIAGNOSIS — R002 Palpitations: Secondary | ICD-10-CM

## 2023-12-27 DIAGNOSIS — I255 Ischemic cardiomyopathy: Secondary | ICD-10-CM

## 2023-12-27 DIAGNOSIS — Z9861 Coronary angioplasty status: Secondary | ICD-10-CM

## 2023-12-27 DIAGNOSIS — I4729 NSVT (nonsustained ventricular tachycardia) (CMS-HCC): Secondary | ICD-10-CM

## 2023-12-27 DIAGNOSIS — I493 Ventricular premature depolarization: Secondary | ICD-10-CM

## 2023-12-27 LAB — LIPID PROFILE
CHOLESTEROL: 114
HDL: 45
LDL: 59
TRIGLYCERIDES: 52
VLDL: 10

## 2024-02-17 ENCOUNTER — Encounter: Admit: 2024-02-17 | Discharge: 2024-02-17 | Payer: MEDICARE

## 2024-02-17 MED ORDER — ATORVASTATIN 80 MG PO TAB
80 mg | ORAL_TABLET | Freq: Every day | ORAL | 1 refills | 90.00000 days | Status: AC
Start: 2024-02-17 — End: ?

## 2024-02-22 ENCOUNTER — Encounter: Admit: 2024-02-22 | Discharge: 2024-02-22 | Payer: MEDICARE

## 2024-02-22 DIAGNOSIS — I25119 Atherosclerotic heart disease of native coronary artery with unspecified angina pectoris: Secondary | ICD-10-CM

## 2024-02-22 DIAGNOSIS — E8881 Metabolic syndrome: Secondary | ICD-10-CM

## 2024-02-22 DIAGNOSIS — R5381 Other malaise: Secondary | ICD-10-CM

## 2024-02-22 DIAGNOSIS — R002 Palpitations: Principal | ICD-10-CM

## 2024-02-22 DIAGNOSIS — R7309 Other abnormal glucose: Secondary | ICD-10-CM

## 2024-02-22 DIAGNOSIS — G4733 Obstructive sleep apnea (adult) (pediatric): Secondary | ICD-10-CM

## 2024-02-22 DIAGNOSIS — E66813 Class 3 severe obesity with serious comorbidity and body mass index (BMI) of 40.0 to 44.9 in adult, unspecified obesity type (CMS-HCC): Secondary | ICD-10-CM

## 2024-02-22 DIAGNOSIS — Z9861 Coronary angioplasty status: Secondary | ICD-10-CM

## 2024-02-22 DIAGNOSIS — I493 Ventricular premature depolarization: Secondary | ICD-10-CM

## 2024-02-22 DIAGNOSIS — I4729 NSVT (nonsustained ventricular tachycardia) (CMS-HCC): Secondary | ICD-10-CM

## 2024-02-22 DIAGNOSIS — I255 Ischemic cardiomyopathy: Secondary | ICD-10-CM

## 2024-02-22 MED ORDER — WEGOVY 0.25 MG/0.5 ML SC PNIJ
.25 mg | SUBCUTANEOUS | 1 refills | 28.00000 days | Status: AC
Start: 2024-02-22 — End: ?

## 2024-02-22 MED ORDER — LISINOPRIL 10 MG PO TAB
10 mg | ORAL_TABLET | Freq: Every day | ORAL | 3 refills | 90.00000 days | Status: AC
Start: 2024-02-22 — End: ?

## 2024-02-22 NOTE — Progress Notes
 Cardiovascular Medicine       Date of Service: 02/22/2024      HPI     Connor Wells is a 68 y.o. male who was seen today in the Cardiovascular Medicine Clinic at [facility] at our [place] office.  He is a former patient of Dr. Renee Hummer.  He also previously saw Dr. Dendi in clinic for evaluation of premature ventricular contractions.     He has a past medical history of coronary artery disease (PCI to LAD in 2022), history of ischemic cardiomyopathy with improved ejection fraction, hypertension, hyperlipidemia, morbid obesity, sleep apnea and premature ventricular contractions.  He presents today for follow-up.   He had some chest pain last year which prompted a stress test that revealed no significant inducible ischemia.  He also had premature ventricular contractions and was started on metoprolol  with significant decrease in burden.      Today, his complaints are exertional shortness of breath and fatigue.  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  He reports that he has noticed decreased exertional capacity, particularly towards the end of the day.  He was diagnosed with severe sleep apnea last year but was not interested in CPAP therapy as he was unsure of being able to tolerate the mask.  He has not seen anyone in sleep clinic yet.  His efforts at weight loss have also not progressed, he was started on Wegovy  but unfortunately Medicare is refusing to cover this despite it being appropriate given his history of coronary artery disease, ischemic cardiomyopathy and increased ASCVD risk.  He also was on Entresto  because of history of ischemic cardiomyopathy but this is financially not feasible for him either.    Cardiac catheterization: 80-90% blockage in proximal LAD, 90% stenosis in diagonal vessel, 70-80% stenosis in superior branch, 40% stenosis in inferior branch, 20-30% narrowing in RCA (04/21/2021)  Stress test: Moderate-sized severe intensity reversible defect in basal mid anterior wall (04/14/2021)  Stress test: No new areas of ischemia, EF 52% (11/2022)  Zio patch: 6.6% PVC burden, short runs of non-sustained VT, short runs of supraventricular tachycardia (06/2021)  Zio patch: 1.9% PVC burden (09/2021)  Sleep study: Severe obstructive sleep apnea, 311 minutes with oxygen level <88% (11/2021)        TTE 09/2023:  No regional wall motion abnormalities are seen. Overall left ventricular systolic function appears normal. The estimated left ventricular ejection fraction is 60%.   Normal left ventricular diastolic function.   Right ventricular chamber dimensions and contractility appear normal.  Normal atrial chamber dimensions.  The aortic valve is tricuspid and mildly sclerotic. There is no evidence of significant valvular regurgitation or stenosis by doppler exam.  The aortic root and the visualized portions of the ascending aorta appear normal in size.  No pericardial effusion is seen.  Assessment & Plan   67 y.o. male patient with the following medical problems:     Coronary artery disease s/p PCI to LAD.  Hypertension.  Hyperlipidemia.  Morbid obesity.  Premature ventricular contractions.  History of ischemic cardiomyopathy with improved ejection fraction.     Preserved EF.  I do believe that his symptoms are likely secondary to untreated sleep apnea.  Have discussed extensively with him the benefits of treating sleep apnea and the overall detriment to his health because of severe untreated obstructive sleep apnea.  He will discuss with PCP about wearing the  CPAP, possibly with nasal pillars as he is unable to tolerate masks and will see somebody in sleep clinic.  Continue metoprolol , aspirin  and atorvastatin .  Entresto  discontinued due to being too expensive.  Will switch to lisinopril  10 mg daily.  Attempted weight loss will clearly benefit him given history of coronary artery disease as well as obstructive sleep apnea.  Wegovy  was prescribed but denied by insurance.  We will appeal this decision.     Return to clinic in 6 months.            Past Medical History  Patient Active Problem List    Diagnosis Date Noted    PVC (premature ventricular contraction) 04/02/2023     Per OV note on 11/12/2022 with Dr. Clotilda Hoos-Thompson    09/09/2021 - Zio Patch:  Sinus rhythm.  Rare premature atrial beats with short episodes of grouped PACs.  Rare PVCs with intermittent aberrant conduction.  Identified PVCs in bigeminal pattern.  No significant pauses or sustained arrhythmias.  No symptoms correlated to times of ectopic beats.        Coronary artery disease with angina pectoris, unspecified vessel or lesion type, unspecified whether native or transplanted heart 08/12/2021    Abnormal stress test 04/21/2021    Cardiomyopathy, unspecified (CMS-HCC) 04/21/2021     06/17/2021 - ECHO:  The left ventricular systolic function is normal. The visually estimated ejection fraction is 60%.  Grade I (mild) left ventricular diastolic dysfunction. Normal left atrial pressure.  The right ventricle is mildly dilated with preserved systolic function.  No significant valvular abnormalities.  Pulmonary artery pressure could not be calculated by this study due to incomplete TR signal.      Coronary artery disease due to lipid rich plaque 04/21/2021     LHC 04/21/21: PCI LAD     06/11/2021 - Regadenoson MPI:    This study is technically problematic and equivocal due to body habitus and class III obesity.  There is very prominent bowel activity artifact.  Overall the study suggests no statistically significant ischemia according to quantitative polar maps but qualitatively there is extensive and diffuse reversible uptake throughout the left ventricular myocardium except for the septum.  Global left ventricular ejection fraction is mildly depressed with a calculated left ventricular ejection fraction of 45%.  Because of the limitations and and the equivocal nature of SPECT imaging in this patient with a BMI of nearly 50, recommend nitrogen 13 ammonia regadenoson stress PET/CT to eliminate confounding attenuation artifacts and to quantitate regional myocardial blood flow under pharmacological stress.  This exam is compared to a previous myocardial perfusion study using technetium sestamibi performed on 04/09/2021.  The previous study also was performed with regadenoson with right bundle branch block and sinus rhythm and was negative for EKG evidence of ischemia.  The previous study had a left ventricular ejection fraction of 36% with a summed stress score of 12 and a rest score of 0.  The previous study left ventricular end-diastolic volume was 181 mL.  The study was interpreted to show moderate-severe reversible basilar anterior and mid anterolateral ischemia extending to the apex.  Qualitatively the studies appear to be similar on the static image datasets but the summed stress score is dramatically less on the current exam.  11/26/2022 - Regadenoson MPI:  This study is normal and represents low probability for significant inducible myocardial ischemia. There are no perfusion abnormalities identified on this study. Regional and global left ventricular function are within normal limits. High risk scintigraphic features  are absent.   This study was compared to the previous one performed on 06/10/2021. The previous perfusion pattern was technically difficult with numerous soft tissue attenuation artifacts. The previous LVEF was 45 % with an LVEDV of 147 mL.  In aggregate the current study is low risk in regards to predicted annual cardiovascular mortality rate.      Chronic fatigue syndrome 04/15/2021    Hypothyroidism 04/15/2021    Obesity 04/15/2021    Chest pain 04/15/2021     04/09/21 Regadenoson MPI:  Abnormal.   Moderate size, severe intensity reversible perfusion defect involving the basal anterior, mid anterior and mid anterolateral segments with extension into the the apical lateral portions of the left ventricle.  There is corresponding hypokinesis  Global LV systolic function is moderately impaired.  LVEF 36%.  LV is mildly dilated at rest without any definite transient ischemic dilatation.  Intermediate risk.      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     Review of Systems  Review of Systems   Constitutional: Positive for weight gain.   HENT: Negative.     Eyes: Negative.    Cardiovascular:  Positive for dyspnea on exertion and leg swelling.   Respiratory:  Positive for shortness of breath.    Endocrine: Negative.    Hematologic/Lymphatic: Negative.    Skin: Negative.    Musculoskeletal:  Positive for back pain, muscle cramps and myalgias.   Gastrointestinal: Negative.    Genitourinary: Negative.    Neurological:  Positive for headaches.   Psychiatric/Behavioral: Negative.     Allergic/Immunologic: Negative.      14 point review of systems negative except as above.    Vitals:    02/22/24 0853   BP: 132/64   BP Source: Arm, Left Upper   Pulse: 68   SpO2: 96%   O2 Device: None (Room air)   PainSc: Two   Weight: (!) 163.4 kg (360 lb 3.2 oz)   Height: 182.9 cm (6')     Body mass index is 48.85 kg/m?SABRA     Physical Exam  General Appearance: no acute distress  HEENT: EOMI, mucous membranes moist, oropharynx is clear  Neck Veins: neck veins are flat & not distended  Carotid Arteries: no bruits  Chest Inspection: chest is normal in appearance  Auscultation/Percussion: lungs clear to auscultation, no rales, rhonchi, or wheezing  Cardiac Rhythm: regular rhythm & normal rate  Cardiac Auscultation: Normal S1 & S2, no S3 or S4, no rub  Murmurs: no cardiac murmurs  Abdominal Exam: soft, non-tender, normal bowel sounds, no masses or bruits  Abdominal aorta: nonpalpable   Liver & Spleen: no organomegaly  Extremities: no lower extremity edema; palpable distal pulses  Skin: warm & intact  Neurologic Exam: oriented to time, place and person; no focal neurologic deficits       Cardiovascular Studies  09/13/23   2D + DOPPLER ECHO   Result Value Ref Range    LVOT diameter 2.21 cm    IVS 0.72 0.6 - 1 cm    LVIDD 5.61 4.2 - 5.8 cm    LVIDS 4.31 2.5 - 4 cm  PW 0.92 0.6 - 1 cm    Left Ventricle Diastolic Volume 115.00 62 - 150 mL    Left Ventricle Diastolic Volume Index 41 34 - 74 milliliters per square meter    Left Ventricle Systolic Volume 48.00 21 - 61 mL    Left Ventricle Systolic Volume Index 17 11 - 31 milliliters per square meter    LVOT peak vel 0.82 m/s    LVOT peak VTI 16.42 cm    TDI lateral e' 0.13 m/s    Right Ventricular Mid Diameter 3.47 1.9 - 3.5 cm    LA size 3.44 3 - 4 cm    LA volume 46.11 18 - 58 mL    Right Atrial Area 15.21 <18 cm2    , with a mean gradient of 3.78 mmHg    AV peak velocity 1.36 m/s    Ao VTI 23.62 cm    MV Peak A Vel 0.67 m/s    MV Peak E Vel PW 0.66 m/s    Right Ventricular Basal Diameter 4.16 2.5 - 4.1 cm    Right Heart Systolic Mmode TAPSE 1.96 >1.7 cm    Right Heart Systolic TDI S' 0.16 m/s    Sinus 3.52 2.8 - 4 cm    Ascending aorta 3.49 cm    BSA 2.79 m2    FS 23.17 28 - 44 %    Teichholtz 39.39 %    Left Atrium Index 16.53 16 - 34 mL/m2    LV mass 171 88 - 224 g    Left Ventricle Mass Index 61 49 - 115 g/m2    RWT 0.33 <=0.42    LVOT area 3.84 cm2    LVOT stroke volume 62.99 cm3    Aortic valve area = 2.67 cm2    AV index (native) 0.60     E/A ratio 0.99     Lateral E/E' ratio 5.08     TR PEAK VELOCITY 2.5 m/s    RV SYSTOLIC PRESSURE 25     AV Stroke Volume Index 21     TDI Medial e' 0.100 m/s    Medial E/E' ratio 6.60 SIMPSON'S BIPLANE EF 61 %    Cardiology Ultrasound Machine Siemens [id number]         Cardiovascular Health Factors  Vitals BP Readings from Last 3 Encounters:   02/22/24 132/64   09/13/23 121/77   09/07/23 114/78     Wt Readings from Last 3 Encounters:   02/22/24 (!) 163.4 kg (360 lb 3.2 oz)   09/13/23 (!) 153.3 kg (338 lb)   09/07/23 (!) 153.5 kg (338 lb 6.4 oz)     BMI Readings from Last 3 Encounters:   02/22/24 48.85 kg/m?   09/13/23 45.84 kg/m?   09/07/23 45.90 kg/m?      Smoking Social History     Tobacco Use   Smoking Status Never   Smokeless Tobacco Never      Lipid Profile Cholesterol   Date Value Ref Range Status   12/27/2023 114  Final     HDL   Date Value Ref Range Status   12/27/2023 45  Final     LDL   Date Value Ref Range Status   12/27/2023 59  Final     Triglycerides   Date Value Ref Range Status   12/27/2023 52  Final      Blood Sugar Hemoglobin A1C   Date Value Ref Range Status   11/19/2022 5.0  Final  Glucose   Date Value Ref Range Status   12/27/2023 99  Final   03/21/2023 98  Final   10/27/2022 91  Final        ASCVD Risk Assessment:     ASCVD 10-year risk calculated: The ASCVD Risk score (Arnett DK, et al., 2019) failed to calculate for the following reasons:    The valid total cholesterol range is 130 to 320 mg/dL     LDL 29-810, if ASCVD 10-y risk is >7.5%, high to moderate-intensity statin therapy is recommended  Diabetes with ASCVD 10-y risk >7.5%, high-intensity statin therapy is recommended.  Diabetes with ASCVD 10-y risk <7.5%, moderate-intensity statin therapy is recommended.      Current Medications (including today's revisions)   aspirin  EC (ASPIR-LOW) 81 mg tablet Take one tablet by mouth daily. Take with food.    atorvastatin  (LIPITOR) 80 mg tablet TAKE ONE TABLET BY MOUTH EVERY DAY    levothyroxine  (SYNTHROID ) 175 mcg tablet Take one tablet by mouth daily.    MAGNESIUM GLYCINATE PO Take 1 tablet by mouth twice daily.    meloxicam (MOBIC) 15 mg tablet Take one tablet by mouth daily. metoprolol  succinate XL (TOPROL  XL) 25 mg extended release tablet TAKE ONE TABLET BY MOUTH EVERY DAY    multivit-min/folic/vit K/lycop (ONE-A-DAY MEN'S MULTIVITAMIN PO) Take 1 tablet by mouth daily.    naproxen sodium (ALEVE) 220 mg tablet Take two tablets by mouth every morning. Take with food.    potassium gluconate 595 mg (99 mg) tab Take one tablet by mouth daily.    primidone  (MYSOLINE ) 50 mg tablet Take three tablets by mouth daily.         Rosamond Boettcher MD  Cardiovascular Medicine.

## 2024-02-23 ENCOUNTER — Encounter: Admit: 2024-02-23 | Discharge: 2024-02-23 | Payer: MEDICARE

## 2024-02-27 ENCOUNTER — Encounter: Admit: 2024-02-27 | Discharge: 2024-02-27 | Payer: MEDICARE

## 2024-03-06 ENCOUNTER — Encounter: Admit: 2024-03-06 | Discharge: 2024-03-06 | Payer: MEDICARE

## 2024-03-07 ENCOUNTER — Encounter: Admit: 2024-03-07 | Discharge: 2024-03-07 | Payer: MEDICARE

## 2024-03-07 NOTE — Progress Notes
 Pharmacy Benefits Investigation    Medication name: semaglutide  (weight loss) (WEGOVY ) 0.25 mg/0.5 mL injector PEN  Medication status: new    The insurance does not allow a prior authorization because the medication is excluded from the plan/benefit.    Almarie Loll  Specialty Pharmacy Patient Advocate

## 2024-03-20 ENCOUNTER — Encounter: Admit: 2024-03-20 | Discharge: 2024-03-20 | Payer: MEDICARE

## 2024-03-20 MED ORDER — METOPROLOL SUCCINATE 25 MG PO TB24
25 mg | ORAL_TABLET | Freq: Every day | ORAL | 3 refills | 90.00000 days | Status: AC
Start: 2024-03-20 — End: ?

## 2024-07-24 ENCOUNTER — Encounter: Admit: 2024-07-24 | Discharge: 2024-07-24 | Payer: MEDICARE

## 2024-07-24 ENCOUNTER — Ambulatory Visit: Admit: 2024-07-24 | Discharge: 2024-07-25 | Payer: MEDICARE

## 2024-07-24 MED ORDER — ZEPBOUND 2.5 MG/0.5 ML SC PNIJ
2.5 mg | SUBCUTANEOUS | 1 refills | 28.00000 days | Status: DC
Start: 2024-07-24 — End: 2024-07-24

## 2024-07-24 MED ORDER — ZEPBOUND 2.5 MG/0.5 ML SC PNIJ
2.5 mg | SUBCUTANEOUS | 1 refills | 28.00000 days | Status: AC
Start: 2024-07-24 — End: ?

## 2024-07-24 NOTE — Progress Notes [1]
 An initial comprehensive medication management visit was completed today via telephone.    Referral reason: GLP1 Initiation and Titration (Cardiology)  Referring provider: Elon Serene, MD    Assessment & Plan     Coronary artery disease, ischemic cardiomyopathy with improved ejection fraction  Indication for GLP1 agonist: Patient would benefit from GLP1 agonist given coronary artery disease s/p PCI to LAD, HFimpEF, and BMI > 30 (BMI 45) in addition to continuing healthy diet and exercise.      Discussed FDA approved indication for tirzepatide  (Zepbound ) to treat moderate to severe obstructive sleep apnea (OSA) in adults with obesity. This is based on the [place]-OSA trial and pt matches baseline characteristics of the trial. Tirzepatide  has also been studied in the [place] trial and showed tirzepatide  led to a lower risk of a composite of death from cardiovascular causes or worsening heart failure than placebo and improved health status in patients with heart failure with preserved ejection fraction and obesity.         Plan  Will send RX for Zepbound  to [facility]; Initial dose - tirzepatide  (Zepbound ) 2.5 mg SQ weekly. PA to be completed by PPAs to determine coverage       Follow-up  The patient will continue to follow up with the pharmacist. Return to pharmacist in 6 weeks via telephone. The return visit was scheduled during today's visit.    Subjective & Objective    Pertinent PMH of coronary artery disease (PCI to LAD in 2022), history of ischemic cardiomyopathy with improved ejection fraction, hypertension, hyperlipidemia, morbid obesity, sleep apnea and premature ventricular contractions.     Sleep study: Severe obstructive sleep apnea, 11/07/21  AHI 63.8. Unable to tolerate CPAP.    As previously reported, he took Victoza  in the past, titrated up to 1.8 mg SQ daily. He took the medication for about 1.5 years and stopped Oct 2024 when he changed to Medicare as they no longer covered Victoza . Reports he tolerated the medication well, no GI side effects and lost between 40-50 lbs while he was taking the medication. Reports he had a plateau in weight loss for the last 3 months of taking the medication. Reports he has gained about 20 lbs back since stopping Victoza .      Wegovy  was not covered on Medicare as criteria is history of MI, stroke, PAD    Reports his PCP tried Zepbound  and it was denied by insurance stating that his BMI was not >30, which is inaccurate.     Obesity    HPI  Indication: BMI > 30 kg/m2  Indication: Coronary artery disease s/p PCI to LAD, HFimpEF, severe obstructive sleep apnea  Previous medications for obesity:   - Contrave (was not effective for weight loss)  - Fen-fen (lost some weight, stopped d/t cardiovascular risk)  - would avoid agents such as phentermine, phentermine/topiramate, naltrexone/Bupropion given cardiovascular history  - Victoza  (stopped d/t insurance coverage)  - Wegovy  - denied by insurance 09/2023 needs history of MI, stroke, PAD    Baseline weight: 153.3 kg  Baseline BMI: 45.8 kg/m2    Safety Assessment  History of pancreatitis: no  Patient or family history of medullary thyroid carcinoma or multiple neoplasia syndrome type 2: no    Patient Reported Readings  Diet:    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 Has not been quite as busy recently since harvest is over.   History of knee replacements     Vitals  BP Readings from Last 3 Encounters:   02/22/24 132/64   09/13/23 121/77   09/07/23 114/78        Wt Readings from Last 5 Encounters:   02/22/24 (!) 163.4 kg (360 lb 3.2 oz)   09/13/23 (!) 153.3 kg (338 lb)   09/07/23 (!) 153.5 kg (338 lb 6.4 oz)   04/07/23 (!) 148 kg (326 lb 3.2 oz)   11/12/22 (!) 144.9 kg (319 lb 6.4 oz)       BMI Readings from Last 5 Encounters:   02/22/24 48.85 kg/m?   09/13/23 45.84 kg/m? 09/07/23 45.90 kg/m?   04/07/23 44.24 kg/m?   11/12/22 43.32 kg/m?      Home weight: 360 lbs    Laboratory Values     Comprehensive Metabolic Profile    Lab Results   Component Value Date/Time    NA 138 12/27/2023 12:00 AM    K 4.3 12/27/2023 12:00 AM    CL 105 12/27/2023 12:00 AM    CO2 24.0 12/27/2023 12:00 AM    GAP 9 12/27/2023 12:00 AM    BUN 23.4 12/27/2023 12:00 AM    CR 0.67 (L) 12/27/2023 12:00 AM    GLU 99 12/27/2023 12:00 AM    Lab Results   Component Value Date/Time    CA 8.9 12/27/2023 12:00 AM    ALBUMIN 3.3 (L) 03/21/2023 12:00 AM    TOTPROT 6.4 03/21/2023 12:00 AM    ALKPHOS 92 03/21/2023 12:00 AM    AST 95 (H) 03/21/2023 12:00 AM    ALT 202 (H) 03/21/2023 12:00 AM    TOTBILI 0.53 03/21/2023 12:00 AM    GFR 103.6 12/27/2023 12:00 AM        eGFR   Date Value Ref Range Status   04/22/2021 >60 >60 mL/min Final     Comment:     eGFR calculated using the CKD-EPIcr_R equation     Lab Results   Component Value Date/Time    MG 2.1 12/27/2023 12:00 AM        Lab Draw:  Lab Results   Component Value Date/Time    HGBA1C 5.0 11/19/2022 12:00 AM    HGBA1C 5.4 08/22/2021 12:00 AM     POC:  No results found for: A1C     Lab Results   Component Value Date    CHOL 114 12/27/2023    TRIG 52 12/27/2023    HDL 45 12/27/2023    LDL 59 12/27/2023    VLDL 10 12/27/2023    CHOLHDLC 3 12/27/2023       Lab Results   Component Value Date/Time    BNP 147 (H) 10/27/2022 12:00 AM       Medication History  Medication history was completed and the patient's medication list was updated to reflect what they are currently taking.    Drug-drug interactions were evaluated. There were clinically significant drug-drug interactions. The following drug-drug interactions were identified: Meloxicam and naproxenRecommended avoiding concomitant use of NSAIDs given increased risk of bleeding, nephrotoxicity. His last AST/ALT were elevated. He hasn't taken acetaminophen in the past for pain.     Home Medications   Medication Sig   aspirin  EC (ASPIR-LOW) 81 mg tablet Take one tablet by mouth daily. Take with food.   atorvastatin  (LIPITOR) 80 mg tablet TAKE ONE TABLET BY MOUTH EVERY DAY  levothyroxine  (SYNTHROID ) 175 mcg tablet Take one tablet by mouth daily.   lisinopriL  (ZESTRIL ) 10 mg tablet Take one tablet by mouth daily.   MAGNESIUM GLYCINATE PO Take 1 tablet by mouth twice daily.   meloxicam (MOBIC) 15 mg tablet Take one tablet by mouth daily.   metoprolol  succinate XL (TOPROL  XL) 25 mg extended release tablet TAKE ONE TABLET BY MOUTH EVERY DAY   multivit-min/folic/vit K/lycop (ONE-A-DAY MEN'S MULTIVITAMIN PO) Take 1 tablet by mouth daily.   naproxen sodium (ALEVE) 220 mg tablet Take two tablets by mouth every morning. Take with food.   potassium gluconate 595 mg (99 mg) tab Take one tablet by mouth daily.   primidone  (MYSOLINE ) 50 mg tablet Take three tablets by mouth daily.   semaglutide  (weight loss) (WEGOVY ) 0.25 mg/0.5 mL injector PEN Inject one-quarter mg under the skin every 7 days.      Education  Education provided: yes    GLP-1 Agonist Teaching - the following topics were discussed:     Overview:  - Storage of medication  - Expiration of medication  - Review of pen (different components)  - What medication should look like  - How often to administer  - Side effects  - Importance of checking blood sugar, if applicable     Supplies:  - Clean injection site with alcohol swab before each injection  - Use a new needle with each injection (Ozempic, Victoza ), use a new pen if autoinjector (Mounjaro, Trulicity, Wegovy )   - Obtain a sharps container or similar for pen needle disposal    Getting Started:  - Perform flow check before first time using a new pen (Ozempic, Saxenda , Victoza )  - Dial to dose prescribed (Ozempic, Saxenda , Victoza )   - Uncap pen (Mounjaro, Trulicity, Wegovy )     Injection Technique:  - Injection location (abdomen preferred, thigh or back of arm if needed)  - Avoid tattoos, scars, stretch marks, sores  - Inject at 90 degree angle  - Press and hold injection button (Mounjaro, Trulicity)  - Toys 'r[employer] us firmly against skin to inject (Wegovy )  - After inserting needle into skin, push all the way down on button to administer dose (Ozempic, Saxenda , Victoza )   - Listen for clicks to indicate dose has been administered if applicable  - Leave needle inserted for 5-10 seconds to allow for absorption (Ozempic, Saxenda , Victoza ) or until audible click indicating needle is retracted (Mounjaro, Trulicity, Wegovy )  - Pull needle straight out of skin (Ozempic, Saxenda , Victoza )    Time spent with patient: 40 minutes  Izetta Sinner, [facility]

## 2024-07-25 ENCOUNTER — Encounter: Admit: 2024-07-25 | Discharge: 2024-07-25 | Payer: MEDICARE

## 2024-07-25 DIAGNOSIS — G4733 Obstructive sleep apnea (adult) (pediatric): Secondary | ICD-10-CM

## 2024-07-25 DIAGNOSIS — Z6841 Body Mass Index (BMI) 40.0 and over, adult: Secondary | ICD-10-CM

## 2024-07-25 DIAGNOSIS — Z9861 Coronary angioplasty status: Secondary | ICD-10-CM

## 2024-07-25 DIAGNOSIS — R7309 Other abnormal glucose: Secondary | ICD-10-CM

## 2024-07-25 DIAGNOSIS — R002 Palpitations: Principal | ICD-10-CM

## 2024-07-25 DIAGNOSIS — R5381 Other malaise: Secondary | ICD-10-CM

## 2024-07-25 DIAGNOSIS — E8881 Metabolic syndrome: Secondary | ICD-10-CM

## 2024-07-25 DIAGNOSIS — I255 Ischemic cardiomyopathy: Secondary | ICD-10-CM

## 2024-07-25 DIAGNOSIS — I493 Ventricular premature depolarization: Secondary | ICD-10-CM

## 2024-07-25 DIAGNOSIS — R5383 Other fatigue: Secondary | ICD-10-CM

## 2024-07-25 DIAGNOSIS — I25119 Atherosclerotic heart disease of native coronary artery with unspecified angina pectoris: Secondary | ICD-10-CM

## 2024-07-25 DIAGNOSIS — I4729 Other ventricular tachycardia (CMS-HCC): Secondary | ICD-10-CM

## 2024-07-25 DIAGNOSIS — E66813 Obesity, class 3: Secondary | ICD-10-CM

## 2024-07-27 ENCOUNTER — Encounter: Admit: 2024-07-27 | Discharge: 2024-07-27 | Payer: MEDICARE

## 2024-08-14 ENCOUNTER — Encounter: Admit: 2024-08-14 | Discharge: 2024-08-14 | Payer: MEDICARE

## 2024-08-14 MED ORDER — ATORVASTATIN 80 MG PO TAB
80 mg | ORAL_TABLET | Freq: Every day | ORAL | 1 refills | 90.00000 days | Status: AC
Start: 2024-08-14 — End: ?

## 2024-08-22 ENCOUNTER — Encounter: Admit: 2024-08-22 | Discharge: 2024-08-22 | Payer: MEDICARE

## 2024-08-22 VITALS — BP 129/83 | HR 68 | Ht 72.0 in | Wt 370.4 lb

## 2024-08-22 DIAGNOSIS — I255 Ischemic cardiomyopathy: Secondary | ICD-10-CM

## 2024-08-22 DIAGNOSIS — Z9861 Coronary angioplasty status: Secondary | ICD-10-CM

## 2024-08-22 DIAGNOSIS — R5381 Other malaise: Secondary | ICD-10-CM

## 2024-08-22 DIAGNOSIS — I4729 NSVT (nonsustained ventricular tachycardia) (CMS-HCC): Secondary | ICD-10-CM

## 2024-08-22 DIAGNOSIS — G4733 Obstructive sleep apnea (adult) (pediatric): Secondary | ICD-10-CM

## 2024-08-22 DIAGNOSIS — R7309 Other abnormal glucose: Secondary | ICD-10-CM

## 2024-08-22 DIAGNOSIS — I493 Ventricular premature depolarization: Secondary | ICD-10-CM

## 2024-08-22 DIAGNOSIS — R0989 Other specified symptoms and signs involving the circulatory and respiratory systems: Secondary | ICD-10-CM

## 2024-08-22 DIAGNOSIS — E66813 Class 3 severe obesity with body mass index (BMI) of 45.0 to 49.9 in adult, unspecified obesity type, unspecified whether serious comorbidity present (CMS-HCC): Secondary | ICD-10-CM

## 2024-08-22 DIAGNOSIS — E8881 Metabolic syndrome: Secondary | ICD-10-CM

## 2024-08-22 DIAGNOSIS — R002 Palpitations: Principal | ICD-10-CM

## 2024-08-22 NOTE — Patient Instructions [37]
 Thank you for visiting our office today.    We would like to make the following medication adjustments:  NONE       Otherwise continue the same medications as you have been doing.          We will be pursuing the following tests after your appointment today:       Orders Placed This Encounter    Comprehensive Metabolic Panel    ECG 12-LEAD         We will plan to see you back in 12 months.  Please call us  in the meantime with any questions or concerns.        Please allow 5-7 business days for our providers to review your results. All normal results will go to MyChart. If you do not have Mychart, it is strongly recommended to get this so you can easily view all your results. If you do not have mychart, we will attempt to call you once with normal lab and testing results. If we cannot reach you by phone with normal results, we will send you a letter.  If you have not heard the results of your testing after one week please give us  a call.       Your Cardiovascular Medicine [place] Team [place], Olam Pierce, Andrea, and [place]­)  phone number is [phone].

## 2024-08-22 NOTE — Progress Notes [1]
 Cardiovascular Medicine       Date of Service: 08/22/2024      HPI     Connor Wells is a 69 y.o. male who was seen today in the Cardiovascular Medicine Clinic at [facility] at our [place] office.  He is a former patient of Dr. Renee Hummer.  He also previously saw Dr. Dendi in clinic for evaluation of premature ventricular contractions.     He has a past medical history of coronary artery disease (PCI to LAD in 2022), history of ischemic cardiomyopathy with improved ejection fraction, hypertension, hyperlipidemia, morbid obesity, sleep apnea and premature ventricular contractions.  He presents today for follow-up.   He had some chest pain last year which prompted a stress test that revealed no significant inducible ischemia.  He also had premature ventricular contractions and was started on metoprolol  with significant decrease in burden.       Today, his complaints are exertional shortness of breath and fatigue.  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  He reports that he has noticed decreased exertional capacity, particularly towards the end of the day.  He was diagnosed with severe sleep apnea last year but was not interested in CPAP therapy as he was unsure of being able to tolerate the mask.  This year, he has tried to use his CPAP but he had an upper respiratory tract infection and was unable to tolerate the machine over the past month.  He reports that he will try to use it again.  We were able to get Zepbound  approved but this has not been sent to him yet.  He also was on Entresto  because of history of ischemic cardiomyopathy but this is financially not feasible for him either.  He also reports that recently, his urine has been darker than usual.  He does not have any painful or difficult urination.     Cardiac catheterization: 80-90% blockage in proximal LAD, 90% stenosis in diagonal vessel, 70-80% stenosis in superior branch, 40% stenosis in inferior branch, 20-30% narrowing in RCA (04/21/2021)  Stress test: Moderate-sized severe intensity reversible defect in basal mid anterior wall (04/14/2021)  Stress test: No new areas of ischemia, EF 52% (11/2022)  Zio patch: 6.6% PVC burden, short runs of non-sustained VT, short runs of supraventricular tachycardia (06/2021)  Zio patch: 1.9% PVC burden (09/2021)  Sleep study: Severe obstructive sleep apnea, 311 minutes with oxygen level <88% (11/2021)        TTE 09/2023:  No regional wall motion abnormalities are seen. Overall left ventricular systolic function appears normal. The estimated left ventricular ejection fraction is 60%.   Normal left ventricular diastolic function.   Right ventricular chamber dimensions and contractility appear normal.  Normal atrial chamber dimensions.  The aortic valve is tricuspid and mildly sclerotic. There is no evidence of significant valvular regurgitation or stenosis by doppler exam.  The aortic root and the visualized portions of the ascending aorta appear normal in size.  No pericardial effusion is seen.  Assessment & Plan   69 y.o. male patient with the following medical problems:     Coronary artery disease s/p PCI to LAD.  Hypertension.  Hyperlipidemia.  Morbid obesity.  Premature ventricular contractions.  History of ischemic cardiomyopathy with improved ejection fraction.     Preserved EF.  I do believe that his symptoms are likely secondary to untreated sleep apnea.  Have discussed  extensively with him the benefits of treating sleep apnea and the overall detriment to his health because of severe untreated obstructive sleep apnea.  He will discuss with PCP about wearing the CPAP, possibly with nasal pillars as he is unable to tolerate masks and will see somebody in sleep clinic.  Continue metoprolol , aspirin  and atorvastatin .  Entresto  discontinued due to being too expensive.  Continue lisinopril  10 mg daily.  Attempted weight loss will clearly benefit him given history of coronary artery disease as well as obstructive sleep apnea.  Will try to make Zepbound  available to him.  Will check CMP.      Return to clinic in 1 year          Past Medical History  Patient Active Problem List    Diagnosis Date Noted    PVC (premature ventricular contraction) 04/02/2023     Per OV note on 11/12/2022 with Dr. Clotilda Hoos-Thompson    09/09/2021 - Zio Patch:  Sinus rhythm.  Rare premature atrial beats with short episodes of grouped PACs.  Rare PVCs with intermittent aberrant conduction.  Identified PVCs in bigeminal pattern.  No significant pauses or sustained arrhythmias.  No symptoms correlated to times of ectopic beats.        Coronary artery disease with angina pectoris, unspecified vessel or lesion type, unspecified whether native or transplanted heart 08/12/2021    Abnormal stress test 04/21/2021    Cardiomyopathy, unspecified (CMS-HCC) 04/21/2021     06/17/2021 - ECHO:  The left ventricular systolic function is normal. The visually estimated ejection fraction is 60%.  Grade I (mild) left ventricular diastolic dysfunction. Normal left atrial pressure.  The right ventricle is mildly dilated with preserved systolic function.  No significant valvular abnormalities.  Pulmonary artery pressure could not be calculated by this study due to incomplete TR signal.      Coronary artery disease due to lipid rich plaque 04/21/2021     LHC 04/21/21: PCI LAD     06/11/2021 - Regadenoson MPI:    This study is technically problematic and equivocal due to body habitus and class III obesity.  There is very prominent bowel activity artifact.  Overall the study suggests no statistically significant ischemia according to quantitative polar maps but qualitatively there is extensive and diffuse reversible uptake throughout the left ventricular myocardium except for the septum.  Global left ventricular ejection fraction is mildly depressed with a calculated left ventricular ejection fraction of 45%.  Because of the limitations and and the equivocal nature of SPECT imaging in this patient with a BMI of nearly 50, recommend nitrogen 13 ammonia regadenoson stress PET/CT to eliminate confounding attenuation artifacts and to quantitate regional myocardial blood flow under pharmacological stress.  This exam is compared to a previous myocardial perfusion study using technetium sestamibi performed on 04/09/2021.  The previous study also was performed with regadenoson with right bundle branch block and sinus rhythm and was negative for EKG evidence of ischemia.  The previous study had a left ventricular ejection fraction of 36% with a summed stress score of 12 and a rest score of 0.  The previous study left ventricular end-diastolic volume was 181 mL.  The study was interpreted to show moderate-severe reversible basilar anterior and mid anterolateral ischemia extending to the apex.  Qualitatively the studies appear to be similar on the static image datasets but the summed stress score is dramatically less on the current exam.  11/26/2022 - Regadenoson MPI:  This study is normal and represents low probability  for significant inducible myocardial ischemia. There are no perfusion abnormalities identified on this study. Regional and global left ventricular function are within normal limits. High risk scintigraphic features are absent.   This study was compared to the previous one performed on 06/10/2021. The previous perfusion pattern was technically difficult with numerous soft tissue attenuation artifacts. The previous LVEF was 45 % with an LVEDV of 147 mL.  In aggregate the current study is low risk in regards to predicted annual cardiovascular mortality rate.      Chronic fatigue syndrome 04/15/2021    Hypothyroidism 04/15/2021    Obesity 04/15/2021    Chest pain 04/15/2021     04/09/21 Regadenoson MPI:  Abnormal.   Moderate size, severe intensity reversible perfusion defect involving the basal anterior, mid anterior and mid anterolateral segments with extension into the the apical lateral portions of the left ventricle.  There is corresponding hypokinesis  Global LV systolic function is moderately impaired.  LVEF 36%.  LV is mildly dilated at rest without any definite transient ischemic dilatation.  Intermediate risk.      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     Review of Systems  Review of Systems   Constitutional: Positive for diaphoresis, malaise/fatigue, night sweats and weight gain.   HENT:  Positive for congestion.    Eyes: Negative.    Cardiovascular:  Positive for chest pain, dyspnea on exertion, irregular heartbeat, leg swelling and palpitations.   Respiratory: Negative.     Endocrine: Negative.    Hematologic/Lymphatic: Negative.    Skin: Negative.    Musculoskeletal:  Positive for falls, joint pain, muscle cramps and myalgias.   Gastrointestinal: Negative.    Genitourinary: Negative.    Neurological:  Positive for headaches.   Psychiatric/Behavioral: Negative.     Allergic/Immunologic: Negative.      14 point review of systems negative except as above.    Vitals:    08/22/24 0829   BP: 129/83   BP Source: Arm, Left Lower   Pulse: 68   SpO2: 96%   O2 Device: None (Room air)   PainSc: Two   Weight: (!) 168 kg (370 lb 6.4 oz)   Height: 182.9 cm (6')     Body mass index is 50.24 kg/m?SABRA     Physical Exam  General Appearance: no acute distress  HEENT: EOMI, mucous membranes moist, oropharynx is clear  Neck Veins: neck veins are flat & not distended  Carotid Arteries: no bruits  Chest Inspection: chest is normal in appearance  Auscultation/Percussion: lungs clear to auscultation, no rales, rhonchi, or wheezing  Cardiac Rhythm: regular rhythm & normal rate  Cardiac Auscultation: Normal S1 & S2, no S3 or S4, no rub  Murmurs: no cardiac murmurs  Abdominal Exam: soft, non-tender, normal bowel sounds, no masses or bruits  Abdominal aorta: nonpalpable   Liver & Spleen: no organomegaly  Extremities: no lower extremity edema; palpable distal pulses  Skin: warm & intact  Neurologic Exam: oriented to time, place and person; no focal neurologic deficits       Cardiovascular Studies  09/13/23   2D + DOPPLER ECHO   Result  Value Ref Range    LVOT diameter 2.21 cm    IVS 0.72 0.6 - 1 cm    LVIDD 5.61 4.2 - 5.8 cm    LVIDS 4.31 2.5 - 4 cm    PW 0.92 0.6 - 1 cm    Left Ventricle Diastolic Volume 115.00 62 - 150 mL    Left Ventricle Diastolic Volume Index 41 34 - 74 milliliters per square meter    Left Ventricle Systolic Volume 48.00 21 - 61 mL    Left Ventricle Systolic Volume Index 17 11 - 31 milliliters per square meter    LVOT peak vel 0.82 m/s    LVOT peak VTI 16.42 cm    TDI lateral e' 0.13 m/s    Right Ventricular Mid Diameter 3.47 1.9 - 3.5 cm    LA size 3.44 3 - 4 cm    LA volume 46.11 18 - 58 mL    Right Atrial Area 15.21 <18 cm2    , with a mean gradient of 3.78 mmHg    AV peak velocity 1.36 m/s    Ao VTI 23.62 cm    MV Peak A Vel 0.67 m/s    MV Peak E Vel PW 0.66 m/s    Right Ventricular Basal Diameter 4.16 2.5 - 4.1 cm    Right Heart Systolic Mmode TAPSE 1.96 >1.7 cm    Right Heart Systolic TDI S' 0.16 m/s    Sinus 3.52 2.8 - 4 cm    Ascending aorta 3.49 cm    BSA 2.79 m2    FS 23.17 28 - 44 %    Teichholtz 39.39 %    Left Atrium Index 16.53 16 - 34 mL/m2    LV mass 171 88 - 224 g    Left Ventricle Mass Index 61 49 - 115 g/m2    RWT 0.33 <=0.42    LVOT area 3.84 cm2    LVOT stroke volume 62.99 cm3    Aortic valve area = 2.67 cm2    AV index (native) 0.60     E/A ratio 0.99     Lateral E/E' ratio 5.08 TR PEAK VELOCITY 2.5 m/s    RV SYSTOLIC PRESSURE 25     AV Stroke Volume Index 21     TDI Medial e' 0.100 m/s    Medial E/E' ratio 6.60     SIMPSON'S BIPLANE EF 61 %    Cardiology Ultrasound Machine Siemens [id number]         Cardiovascular Health Factors  Vitals BP Readings from Last 3 Encounters:   08/22/24 129/83   02/22/24 132/64   09/13/23 121/77     Wt Readings from Last 3 Encounters:   08/22/24 (!) 168 kg (370 lb 6.4 oz)   02/22/24 (!) 163.4 kg (360 lb 3.2 oz)   09/13/23 (!) 153.3 kg (338 lb)     BMI Readings from Last 3 Encounters:   08/22/24 50.24 kg/m?   02/22/24 48.85 kg/m?   09/13/23 45.84 kg/m?      Smoking Tobacco Use History[1]   Lipid Profile Cholesterol   Date Value Ref Range Status   12/27/2023 114  Final     HDL   Date Value Ref Range Status   12/27/2023 45  Final     LDL   Date Value Ref Range Status   12/27/2023 59  Final     Triglycerides   Date Value Ref Range Status   12/27/2023 52  Final  Blood Sugar Hemoglobin A1C   Date Value Ref Range Status   11/19/2022 5.0  Final     Glucose   Date Value Ref Range Status   12/27/2023 99  Final   03/21/2023 98  Final   10/27/2022 91  Final        ASCVD Risk Assessment:     ASCVD 10-year risk calculated: The ASCVD Risk score (Arnett DK, et al., 2019) failed to calculate for the following reasons:    The valid total cholesterol range is 130 to 320 mg/dL    * - Cholesterol units were assumed     LDL 70-189, if ASCVD 10-y risk is >7.5%, high to moderate-intensity statin therapy is recommended  Diabetes with ASCVD 10-y risk >7.5%, high-intensity statin therapy is recommended.  Diabetes with ASCVD 10-y risk <7.5%, moderate-intensity statin therapy is recommended.      Current Medications (including today's revisions)   aspirin  EC (ASPIR-LOW) 81 mg tablet Take one tablet by mouth daily. Take with food.    atorvastatin  (LIPITOR) 80 mg tablet TAKE ONE TABLET BY MOUTH EVERY DAY    baclofen (LIORESAL) 10 mg tablet Take one tablet by mouth as Needed. levothyroxine  (SYNTHROID ) 175 mcg tablet Take one tablet by mouth daily.    lisinopriL  (ZESTRIL ) 10 mg tablet Take one tablet by mouth daily.    MAGNESIUM GLYCINATE PO Take 1 tablet by mouth twice daily.    meloxicam (MOBIC) 15 mg tablet Take one tablet by mouth daily.    metoprolol  succinate XL (TOPROL  XL) 25 mg extended release tablet TAKE ONE TABLET BY MOUTH EVERY DAY    multivit-min/folic/vit K/lycop (ONE-A-DAY MEN'S MULTIVITAMIN PO) Take 1 tablet by mouth daily.    naproxen sodium (ALEVE) 220 mg tablet Take two tablets by mouth as Needed. Take with food.    potassium gluconate 595 mg (99 mg) tab Take one tablet by mouth daily.    primidone  (MYSOLINE ) 50 mg tablet Take three tablets by mouth daily.    tirzepatide  (weight loss) (ZEPBOUND ) 2.5 mg/0.5 mL injection PEN Inject 0.5 mL under the skin every 7 days. Indications: sleep apnea due to airway obstruction (Patient not taking: Reported on 08/22/2024)         Rosamond Boettcher MD  Cardiovascular Medicine.         [1]   Social History  Tobacco Use   Smoking Status Never   Smokeless Tobacco Never

## 2024-08-23 ENCOUNTER — Encounter: Admit: 2024-08-23 | Discharge: 2024-08-23 | Payer: MEDICARE

## 2024-08-31 ENCOUNTER — Encounter: Admit: 2024-08-31 | Discharge: 2024-08-31 | Payer: MEDICARE

## 2024-08-31 ENCOUNTER — Ambulatory Visit: Admit: 2024-08-31 | Discharge: 2024-09-01 | Payer: MEDICARE

## 2024-08-31 NOTE — Progress Notes [1]
 Pharmacy Benefits Investigation    Medication name: tirzepatide  (weight loss) (ZEPBOUND ) 2.5 mg/0.5 mL injection PEN  Medication status: new    The insurance requires a prior authorization for the medication. The prior authorization was submitted via CoverMyMeds. via [email or web address]. Will provide update when available from payor or within 4 business days.      Izetta Sinner, PHARMD

## 2024-09-01 ENCOUNTER — Encounter: Admit: 2024-09-01 | Discharge: 2024-09-01 | Payer: MEDICARE

## 2024-09-11 ENCOUNTER — Encounter: Admit: 2024-09-11 | Discharge: 2024-09-11 | Payer: MEDICARE

## 2024-09-13 ENCOUNTER — Encounter: Admit: 2024-09-13 | Discharge: 2024-09-13 | Payer: MEDICARE

## 2024-09-14 ENCOUNTER — Encounter: Admit: 2024-09-14 | Discharge: 2024-09-14 | Payer: MEDICARE

## 2024-09-14 DIAGNOSIS — E8881 Metabolic syndrome: Secondary | ICD-10-CM

## 2024-09-14 DIAGNOSIS — R002 Palpitations: Principal | ICD-10-CM

## 2024-09-14 DIAGNOSIS — G4733 Obstructive sleep apnea (adult) (pediatric): Secondary | ICD-10-CM

## 2024-09-14 DIAGNOSIS — R5381 Other malaise: Secondary | ICD-10-CM

## 2024-09-14 DIAGNOSIS — I493 Ventricular premature depolarization: Secondary | ICD-10-CM

## 2024-09-14 LAB — COMPREHENSIVE METABOLIC PANEL
ALBUMIN: 3.8
ALK PHOSPHATASE: 92
ALT: 64 — ABNORMAL HIGH (ref <45)
ANION GAP: 10
AST: 39 — ABNORMAL HIGH (ref 11–34)
BLD UREA NITROGEN: 18
CALCIUM: 9
CHLORIDE: 105
CO2: 22 — ABNORMAL LOW (ref 23–31)
CREATININE: 0.8
GFR ESTIMATED: 97
GLUCOSE,PANEL: 123 — ABNORMAL HIGH (ref 70–105)
POTASSIUM: 4.4
SODIUM: 137
TOTAL BILIRUBIN: 0.4
TOTAL PROTEIN: 6.5
